# Patient Record
Sex: Male | Born: 1955 | Race: White | Hispanic: No | Marital: Married | Smoking: Never smoker
Health system: Southern US, Community
[De-identification: ages and names within clinical notes are randomized; demographics above are authoritative.]

## PROBLEM LIST (undated history)

## (undated) DIAGNOSIS — E7800 Pure hypercholesterolemia, unspecified: Secondary | ICD-10-CM

## (undated) DIAGNOSIS — F419 Anxiety disorder, unspecified: Secondary | ICD-10-CM

## (undated) DIAGNOSIS — F0280 Dementia in other diseases classified elsewhere without behavioral disturbance: Secondary | ICD-10-CM

## (undated) DIAGNOSIS — I10 Essential (primary) hypertension: Secondary | ICD-10-CM

## (undated) HISTORY — PX: HERNIA REPAIR: SHX51

---

## 1999-11-16 ENCOUNTER — Ambulatory Visit (HOSPITAL_COMMUNITY): Admission: RE | Admit: 1999-11-16 | Discharge: 1999-11-16 | Payer: Self-pay | Admitting: Gastroenterology

## 2002-12-07 ENCOUNTER — Emergency Department (HOSPITAL_COMMUNITY): Admission: EM | Admit: 2002-12-07 | Discharge: 2002-12-07 | Payer: Self-pay | Admitting: *Deleted

## 2002-12-07 ENCOUNTER — Encounter: Payer: Self-pay | Admitting: General Surgery

## 2004-11-09 ENCOUNTER — Ambulatory Visit (HOSPITAL_COMMUNITY): Admission: RE | Admit: 2004-11-09 | Discharge: 2004-11-09 | Payer: Self-pay | Admitting: Gastroenterology

## 2006-05-24 ENCOUNTER — Emergency Department (HOSPITAL_COMMUNITY): Admission: EM | Admit: 2006-05-24 | Discharge: 2006-05-24 | Payer: Self-pay | Admitting: Emergency Medicine

## 2009-05-31 ENCOUNTER — Emergency Department (HOSPITAL_COMMUNITY): Admission: EM | Admit: 2009-05-31 | Discharge: 2009-06-01 | Payer: Self-pay | Admitting: Emergency Medicine

## 2010-10-05 ENCOUNTER — Encounter
Admission: RE | Admit: 2010-10-05 | Discharge: 2010-10-05 | Payer: Self-pay | Source: Home / Self Care | Attending: Family Medicine | Admitting: Family Medicine

## 2010-12-18 LAB — COMPREHENSIVE METABOLIC PANEL
ALT: 20 U/L (ref 0–53)
AST: 27 U/L (ref 0–37)
Albumin: 4.3 g/dL (ref 3.5–5.2)
Alkaline Phosphatase: 59 U/L (ref 39–117)
BUN: 12 mg/dL (ref 6–23)
CO2: 22 mEq/L (ref 19–32)
Calcium: 9.3 mg/dL (ref 8.4–10.5)
Chloride: 101 mEq/L (ref 96–112)
Creatinine, Ser: 0.93 mg/dL (ref 0.4–1.5)
GFR calc Af Amer: >60 mL/min (ref >60)
GFR calc non Af Amer: >60 mL/min (ref >60)
Glucose, Bld: 104 mg/dL — ABNORMAL HIGH (ref 70–99)
Potassium: 3.6 mEq/L (ref 3.5–5.1)
Sodium: 134 mEq/L — ABNORMAL LOW (ref 135–145)
Total Bilirubin: 0.5 mg/dL (ref 0.3–1.2)
Total Protein: 7.8 g/dL (ref 6.0–8.3)

## 2010-12-18 LAB — CK TOTAL AND CKMB (NOT AT ARMC)
CK, MB: 2.4 ng/mL (ref 0.3–4.0)
Relative Index: 0.3 (ref 0.0–2.5)
Relative Index: 0.5 (ref 0.0–2.5)
Total CK: 464 U/L — ABNORMAL HIGH (ref 7–232)
Total CK: 734 U/L — ABNORMAL HIGH (ref 7–232)

## 2010-12-18 LAB — CBC
HCT: 43.5 % (ref 39.0–52.0)
Hemoglobin: 15 g/dL (ref 13.0–17.0)
MCHC: 34.4 g/dL (ref 30.0–36.0)
MCV: 90.8 fL (ref 78.0–100.0)
Platelets: 254 10*3/uL (ref 150–400)
RBC: 4.79 MIL/uL (ref 4.22–5.81)
RDW: 12.8 % (ref 11.5–15.5)
WBC: 14.3 10*3/uL — ABNORMAL HIGH (ref 4.0–10.5)

## 2010-12-18 LAB — DIFFERENTIAL
Eosinophils Absolute: 0 10*3/uL (ref 0.0–0.7)
Lymphs Abs: 1.3 10*3/uL (ref 0.7–4.0)
Monocytes Relative: 5 % (ref 3–12)
Neutro Abs: 12.2 10*3/uL — ABNORMAL HIGH (ref 1.7–7.7)
Neutrophils Relative %: 85 % — ABNORMAL HIGH (ref 43–77)

## 2010-12-18 LAB — LIPASE, BLOOD: Lipase: 23 U/L (ref 11–59)

## 2010-12-18 LAB — TROPONIN I: Troponin I: 0.01 ng/mL (ref 0.00–0.06)

## 2011-01-29 NOTE — Op Note (Signed)
NAMEABDOU, STOCKS               ACCOUNT NO.:  [id number]   MEDICAL RECORD NO.:  [id number]          PATIENT TYPE:  AMB   LOCATION:  ENDO                         FACILITY:  [facility]   PHYSICIAN:  James L. Malon Kindle., M.D.DATE OF BIRTH:  [birth year]   DATE OF PROCEDURE:  11/09/2004  DATE OF DISCHARGE:                                 OPERATIVE REPORT   PROCEDURE:  Colonoscopy.   MEDICATIONS:  Fentanyl 100 mcg, Versed 10 mg IV.   INDICATIONS FOR PROCEDURE:  Rectal bleeding in a gentleman with a strong  family history of colon cancer in his mother.   DESCRIPTION OF PROCEDURE:  The procedure had been explained to the patient  and consent obtained. With the patient in the left lateral decubitus  position, the Olympus pediatric scope was inserted and advanced. The prep  was excellent. We were able to reach the cecum without difficulty. The scope  was withdrawn and the cecum, ascending colon, hepatic flexure, transverse  colon, splenic flexure, descending and sigmoid colon were seen well. No  polyps were seen throughout the entire colon. There was no diverticular  disease. The scope was withdrawn down the rectum. There were internal  hemorrhoids seen in the rectum upon removal of the scope. The scope was  withdrawn. The patient tolerated the procedure well.   ASSESSMENT:  1.  Rectal bleeding, 578.1.  2.  Family history of colon cancer, V16.0.  3.  Internal hemorrhoids, 455.0.   PLAN:  Will recommend yearly Hemoccults and repeat colonoscopy in five  years.      JLE/MEDQ  D:  11/09/2004  T:  11/09/2004  Job:  [id number]   cc:   Schuyler Amor, M.D.  [address]  [place], [place] [address]  Fax: [phone]

## 2011-01-29 NOTE — Consult Note (Signed)
[name], [name]                      ACCOUNT NO.:  [id number]   MEDICAL RECORD NO.:  [id number]                   PATIENT TYPE:  EMS   LOCATION:  ED                                   FACILITY:  [facility]   PHYSICIAN:  Angelia Mould. Derrell Lolling, M.D.             DATE OF BIRTH:  05/03/1956   DATE OF CONSULTATION:  12/07/2002  DATE OF DISCHARGE:                                   CONSULTATION   CHIEF COMPLAINT:  Diarrhea and fatigue.   HISTORY OF PRESENT ILLNESS:  This is a 55 year old white man who became ill  yesterday with fatigue and low-grade fever and chills.  At about 8 p.m. last  night he began having diarrhea and states that he has been having fairly  significant non-bloody diarrhea with stools and flatus occurring about every  15 minutes.  He denies nausea or vomiting.  He denies pain or cramps.  He  has not had any prior similar problems.  He has not seen any blood in his  stool.  His bowel movements are usually normal.  Because this has continued  and he was getting quite fatigued, he went to [facility]  today.  Abdominal x-rays were performed there which showed a significantly  distended colon with a dilated cecum, transverse colon, descending colon,  and sigmoid colon.  They were concerned about a bowel obstruction.  He was  sent to the emergency department for an emergency department physician  evaluation.   Dr. Mariel Aloe called me and asked me to evaluate the patient.   Currently, the patient denies pain and states that he is hungry, but he is  tired.  His last bowel movement and passage of flatus was about 15 minutes  ago.   PAST MEDICAL HISTORY:  1. Stress disorder and some type of psychiatric problem for which he is     followed by a psychiatrist, Dr. Meredith Staggers.  2. Tonsillectomy in 1965.  3. Right inguinal hernia repair at age 59.  4. History of sinus infections in the past.  5. He had some bleeding from hemorrhoids a couple of years ago, and  had a     colonoscopy by Dr. Carman Ching in 3/01, and he states that Dr. Randa Evens     told him that the colonoscopy was normal except for hemorrhoids.   CURRENT MEDICATIONS:  1. Prozac one tablet daily.  2. Klonopin two tablets daily.   ALLERGIES:  No known drug allergies.   FAMILY HISTORY:  Mother living has Alzheimer's.  Has had a small bowel  obstruction for carcinoma of the small intestine.  She has glaucoma.  Father  deceased in 26, from prostate cancer.  He also had hypertension.  He has  two brothers living and well.  There is no family history of diabetes or  cancer otherwise.   SOCIAL HISTORY:  The patient is married, they have no children.  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  Denies the use of tobacco.  He  drinks alcohol infrequently.   REVIEW OF SYMPTOMS:  CONSTITUTIONAL:  No weight gain or weight loss.  Some  low-grade fever and chills.  EYES:  No visual change, no diplopia, no eye  problems.  HEENT:  He does have a history of sinusitis.  No history of  facial fracture.  No history of lesions of the tongue or oropharynx.  No  history of thyroid disease.  RESPIRATORY:  Denies asthma, bronchitis,  shortness of breath, or orthopnea.  CARDIAC:  No chest pain, no  palpitations, no history of cardiac disease or valvular heart problems.  GASTROINTESTINAL:  Please see history of present illness.  MUSCULOSKELETAL:  Denies orthopaedic injuries or fractures.  Denies arthritis.  NEUROLOGIC:  No history of seizures or syncope.  PSYCHIATRIC:  Does have a stress  disorder and possible depression, followed by a psychiatrist here in town.   PHYSICAL EXAMINATION:  GENERAL:  A pleasant middle-aged man slightly  overweight.  Appears somewhat fatigued, but really is in no acute distress.  VITAL SIGNS:  Temperature 97.4, heart rate 121 and regular, respirations 20,  blood pressure 136/83.  HEENT:  Eyes:  Sclerae clear.  Extraocular movements were intact.  Nose,   lips, tongue, and oropharynx without gross lesions.  NECK:  No thyroid mass, no adenopathy, no jugular venous distention,  nontender.  Trachea midline.  RESPIRATORY:  Lungs clear to auscultation, no wheezing, no CVA tenderness.  CARDIOVASCULAR:  Heart with a regular tachycardia, no murmurs or rubs.  Radial, femoral, and posterior tibial pulses are palpable.  ABDOMEN:  The abdomen is borderline obese, soft, nontender, nondistended,  bowel sounds are present.  There is no hernia.  There is no palpable mass.  The abdominal examination is essentially benign.  GENITOURINARY:  Penis, scrotum, and testes are normal.  RECTAL:  Deferred.  MUSCULOSKELETAL:  Full range of motion, no deformity, no edema.  NEUROLOGIC:  No gross motor or sensory deficits.   LABORATORY DATA:  Repeat abdominal films show some air throughout the colon,  but the colon is not distended at all, suggesting it has been decompressed  since the films earlier in the day.  There is no free air or obstructive  pattern.  Hemoglobin 14, white blood cell count 6200.  Chemistries are  pending.   ASSESSMENT:  1. Acute diarrhea syndrome.  Suspect viral gastroenteritis.  2. I doubt that he has a bowel obstruction or any acute surgical problem.  3. He does appear to be dehydrated.   PLAN:  1. The patient will get 2 to 3 L of IV fluids to correct his dehydration.  I     have turned his care over to Dr Susy Manor who is the emergency     department physician who agrees to assume his care.  He will check on the     chemistries and see that he is stabilized before going home.  2. The patient will return to see me p.r.n.                                               Angelia Mould. Derrell Lolling, M.D.    HMI/MEDQ  D:  12/07/2002  T:  12/08/2002  Job:  [id number]

## 2011-04-03 ENCOUNTER — Emergency Department (HOSPITAL_COMMUNITY): Payer: BC Managed Care – PPO

## 2011-04-03 ENCOUNTER — Inpatient Hospital Stay (HOSPITAL_COMMUNITY)
Admission: EM | Admit: 2011-04-03 | Discharge: 2011-04-08 | DRG: 297 | Disposition: A | Discharge status: Home / Self Care | Payer: BC Managed Care – PPO | Attending: Internal Medicine | Admitting: Internal Medicine

## 2011-04-03 DIAGNOSIS — T43505A Adverse effect of unspecified antipsychotics and neuroleptics, initial encounter: Secondary | ICD-10-CM | POA: Diagnosis present

## 2011-04-03 DIAGNOSIS — S0180XA Unspecified open wound of other part of head, initial encounter: Secondary | ICD-10-CM | POA: Diagnosis present

## 2011-04-03 DIAGNOSIS — E871 Hypo-osmolality and hyponatremia: Principal | ICD-10-CM | POA: Diagnosis present

## 2011-04-03 DIAGNOSIS — H6690 Otitis media, unspecified, unspecified ear: Secondary | ICD-10-CM | POA: Diagnosis present

## 2011-04-03 DIAGNOSIS — E785 Hyperlipidemia, unspecified: Secondary | ICD-10-CM | POA: Diagnosis present

## 2011-04-03 DIAGNOSIS — W010XXA Fall on same level from slipping, tripping and stumbling without subsequent striking against object, initial encounter: Secondary | ICD-10-CM | POA: Diagnosis present

## 2011-04-03 DIAGNOSIS — R680 Hypothermia, not associated with low environmental temperature: Secondary | ICD-10-CM | POA: Diagnosis present

## 2011-04-03 DIAGNOSIS — I10 Essential (primary) hypertension: Secondary | ICD-10-CM | POA: Diagnosis present

## 2011-04-03 DIAGNOSIS — F341 Dysthymic disorder: Secondary | ICD-10-CM | POA: Diagnosis present

## 2011-04-03 DIAGNOSIS — D72829 Elevated white blood cell count, unspecified: Secondary | ICD-10-CM | POA: Diagnosis present

## 2011-04-03 DIAGNOSIS — G9341 Metabolic encephalopathy: Secondary | ICD-10-CM | POA: Diagnosis present

## 2011-04-03 DIAGNOSIS — Y9289 Other specified places as the place of occurrence of the external cause: Secondary | ICD-10-CM

## 2011-04-03 DIAGNOSIS — R82998 Other abnormal findings in urine: Secondary | ICD-10-CM | POA: Diagnosis present

## 2011-04-04 ENCOUNTER — Inpatient Hospital Stay (HOSPITAL_COMMUNITY): Payer: BC Managed Care – PPO

## 2011-04-04 LAB — URINALYSIS, ROUTINE W REFLEX MICROSCOPIC
Bilirubin Urine: NEGATIVE
Glucose, UA: NEGATIVE mg/dL
Hgb urine dipstick: NEGATIVE
Ketones, ur: 40 mg/dL — AB
Leukocytes, UA: NEGATIVE
pH: 7.5 (ref 5.0–8.0)

## 2011-04-04 LAB — COMPREHENSIVE METABOLIC PANEL
ALT: 27 U/L (ref 0–53)
AST: 31 U/L (ref 0–37)
Alkaline Phosphatase: 65 U/L (ref 39–117)
CO2: 25 mEq/L (ref 19–32)
Chloride: 74 mEq/L — ABNORMAL LOW (ref 96–112)
Creatinine, Ser: 0.61 mg/dL (ref 0.50–1.35)
GFR calc non Af Amer: >60 mL/min (ref >60)
Sodium: 108 mEq/L — CL (ref 135–145)
Total Bilirubin: 0.8 mg/dL (ref 0.3–1.2)

## 2011-04-04 LAB — RAPID URINE DRUG SCREEN, HOSP PERFORMED
Amphetamines: NOT DETECTED
Barbiturates: NOT DETECTED
Benzodiazepines: POSITIVE — AB
Tetrahydrocannabinol: NOT DETECTED

## 2011-04-04 LAB — BASIC METABOLIC PANEL
BUN: 7 mg/dL (ref 6–23)
CO2: 21 mEq/L (ref 19–32)
CO2: 25 mEq/L (ref 19–32)
CO2: 25 mEq/L (ref 19–32)
Calcium: 8.1 mg/dL — ABNORMAL LOW (ref 8.4–10.5)
Calcium: 8.3 mg/dL — ABNORMAL LOW (ref 8.4–10.5)
Calcium: 8.4 mg/dL (ref 8.4–10.5)
Creatinine, Ser: 0.67 mg/dL (ref 0.50–1.35)
GFR calc non Af Amer: >60 mL/min (ref >60)
Glucose, Bld: 100 mg/dL — ABNORMAL HIGH (ref 70–99)
Glucose, Bld: 112 mg/dL — ABNORMAL HIGH (ref 70–99)
Sodium: 109 mEq/L — CL (ref 135–145)
Sodium: 113 mEq/L — CL (ref 135–145)

## 2011-04-04 LAB — DIFFERENTIAL
Basophils Absolute: 0 10*3/uL (ref 0.0–0.1)
Basophils Relative: 0 % (ref 0–1)
Eosinophils Absolute: 0.1 10*3/uL (ref 0.0–0.7)
Eosinophils Relative: 1 % (ref 0–5)
Lymphocytes Relative: 14 % (ref 12–46)
Lymphs Abs: 1.8 10*3/uL (ref 0.7–4.0)
Monocytes Absolute: 1 10*3/uL (ref 0.1–1.0)
Monocytes Relative: 7 % (ref 3–12)
Neutro Abs: 11.1 10*3/uL — ABNORMAL HIGH (ref 1.7–7.7)
Neutrophils Relative %: 85 % — ABNORMAL HIGH (ref 43–77)

## 2011-04-04 LAB — URINALYSIS, MICROSCOPIC ONLY
Bilirubin Urine: NEGATIVE
Nitrite: NEGATIVE
Specific Gravity, Urine: 1.018 (ref 1.005–1.030)
Urobilinogen, UA: 0.2 mg/dL (ref 0.0–1.0)

## 2011-04-04 LAB — VALPROIC ACID LEVEL: Valproic Acid Lvl: 16.4 ug/mL — ABNORMAL LOW (ref 50.0–100.0)

## 2011-04-04 LAB — CBC
HCT: 34.2 % — ABNORMAL LOW (ref 39.0–52.0)
Hemoglobin: 12.4 g/dL — ABNORMAL LOW (ref 13.0–17.0)
MCH: 30.4 pg (ref 26.0–34.0)
MCHC: 36 g/dL (ref 30.0–36.0)
MCV: 84.2 fL (ref 78.0–100.0)
Platelets: 272 10*3/uL (ref 150–400)
RBC: 4.08 MIL/uL — ABNORMAL LOW (ref 4.22–5.81)
RDW: 11.9 % (ref 11.5–15.5)

## 2011-04-04 LAB — LACTIC ACID, PLASMA: Lactic Acid, Venous: 2.6 mmol/L — ABNORMAL HIGH (ref 0.5–2.2)

## 2011-04-04 LAB — MRSA PCR SCREENING: MRSA by PCR: NEGATIVE

## 2011-04-04 LAB — CORTISOL: Cortisol, Plasma: 20.2 ug/dL

## 2011-04-04 LAB — NA AND K (SODIUM & POTASSIUM), RAND UR: Sodium, Ur: 86 mEq/L

## 2011-04-04 LAB — PROCALCITONIN: Procalcitonin: <0.1 ng/mL

## 2011-04-04 MED ORDER — GADOBENATE DIMEGLUMINE 529 MG/ML IV SOLN
7.0000 mL | Freq: Once | INTRAVENOUS | Status: AC | PRN
  Administered 2011-04-04: 7 mL via INTRAVENOUS

## 2011-04-04 NOTE — Consult Note (Signed)
[name], WORD NO.:  [id number]  MEDICAL RECORD NO.:  [id number]  LOCATION:  1239                         FACILITY:  [facility]  PHYSICIAN:  Maree Krabbe, M.D.DATE OF BIRTH:  1956-05-27  DATE OF CONSULTATION:  04/04/2011 DATE OF DISCHARGE:                                CONSULTATION   Renal Consultation  REASON FOR CONSULTATION:  Hyponatremia and altered mental status.  HISTORY:  Patient is a 55 year old male, relatively healthy, takes medicine for hyperlipidemia, anxiety, panic attacks.  He presented to the Emergency Room with confusion and falling.  He fell in the Emergency Room parking lot getting out of the car and scraped up his face and had some stitches put in.  Blood work returned with a sodium of 109.  The head CT was negative.  The patient was noted to be somewhat confused with slow speech response and some slurring of the speech.  He was admitted to ICU.  The patient currently denies any recent use of over- the-counter medicines in large amounts.  He takes an occasional BC or NSAID, not a lot lately.  Denies any diuretic use.  History of high blood pressure.  He says he takes Klonopin for anxiety for last 5 years and cholesterol medication, simvastatin for the last two or three years. He is married, lives with his wife at home and have no children.  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 He does not smoke or drink or do any drugs.  PAST MEDICAL HISTORY: 1. Anxiety. 2. Hyperlipidemia. 3. Panic attacks.  PAST SURGICAL HISTORY:  Noncontributory.  MEDICATIONS: 1. Simvastatin. 2. Klonopin. 3. Nasal decongestant.  According to the patient.  SOCIAL HISTORY:  As above.  REVIEW OF SYSTEMS:  Denies any fever, chills, sweats, shortness of breath, cough, chest pain, abdominal pain, nausea, recent nausea, vomiting, diarrhea, difficulty voiding or dysuria, joint pain or swelling, skin rash or itching,  focal numbness or weakness.  PHYSICAL EXAMINATION:  VITAL SIGNS:  Temperature is 96.5, blood pressure 107/50, pulse 80, respirations 20. GENERAL:  The patient is overweight, middle-aged male, in no distress. SKIN:  Warm and dry.  He has several scrapes and couple of lacerations on his face. HEENT:  PERRL, EOMI.  Sclerae anicteric.  The pupils are equally reactive.  The gaze is conjugate.  Throat is clear. NECK:  Supple with no JVD or thyromegaly. CHEST:  Clear throughout.  No rales, rhonchi or wheezing. CARDIAC:  Regular rate and rhythm without murmur, rub or gallop. ABDOMEN:  Obese, nontender, soft.  Active bowel sounds. GU:  Normal male genitalia.  Has a Foley catheter in place. EXTREMITIES:  No ankle edema.  No joint effusion or deformity. NEUROLOGIC:  Moves all four extremities equally well.  Speech is slow and is a bit slurred and patient is slow to respond, buy ties his best.  LABORATORY DATA:  Sodium 108, BUN 10, creatinine 0.6, potassium 4.5, CO2 of 25, magnesium 2.0, calcium 8.6, albumin 3.4.  Hemoglobin 12, white blood count 12.  Glucose 136.  Urine osmolality 414.  Urine sodium 86, urine potassium 53, urine sodium plus potassium equals 139.  IMPRESSION: 1. Hyponatremia, severe  with moderately severe symptoms of confusion     and altered speech.  This is probably a subacute or chronic     presentation since the patient does not give a history of any     recent surgery, marathon race, etc.  Urine     osmolality is high, urine Na is high, so this may be consistent      with the syndrome of inappropriate antidiuretic hormone (SIADH).     Recommend treating with 3% hypertonic saline for now at a modest     rate to correct serum sodium 10 mEq/L over a 24-hour period.  This     would be an estimated sodium deficit of 480 mEq, which would equal     37 mL an hour for 24 hours using hypertonic saline.  We will start 30     mL an hour and measure Chem-7 every 4 hours for now.  Once  the     serum sodium is up around 120, we can initiate oral therapy. 2. Altered mental status due to hyponatremia.  CT scan negative. 3. Hyperlipidemia. 4. Anxiety disorder.  PLAN AND RECOMMENDATIONS:  Check TSH, cortisol.  Check BMET q.4h. Hypertonic saline at 30 mL an hour for now.     Maree Krabbe, M.D.     RDS/MEDQ  D:  04/04/2011  T:  04/04/2011  Job:  [id number]  Electronically Signed by Delano Metz M.D. on 04/04/2011 08:53:05 PM

## 2011-04-05 LAB — BASIC METABOLIC PANEL
BUN: 6 mg/dL (ref 6–23)
BUN: 6 mg/dL (ref 6–23)
BUN: 7 mg/dL (ref 6–23)
CO2: 23 mEq/L (ref 19–32)
CO2: 25 mEq/L (ref 19–32)
Chloride: 90 mEq/L — ABNORMAL LOW (ref 96–112)
Chloride: 93 mEq/L — ABNORMAL LOW (ref 96–112)
Chloride: 92 mEq/L — ABNORMAL LOW (ref 96–112)
Chloride: 93 mEq/L — ABNORMAL LOW (ref 96–112)
Creatinine, Ser: 0.6 mg/dL (ref 0.50–1.35)
GFR calc Af Amer: >60 mL/min (ref >60)
GFR calc Af Amer: >60 mL/min (ref >60)
GFR calc Af Amer: >60 mL/min (ref >60)
GFR calc Af Amer: >60 mL/min (ref >60)
GFR calc non Af Amer: >60 mL/min (ref >60)
GFR calc non Af Amer: >60 mL/min (ref >60)
GFR calc non Af Amer: >60 mL/min (ref >60)
Glucose, Bld: 101 mg/dL — ABNORMAL HIGH (ref 70–99)
Glucose, Bld: 89 mg/dL (ref 70–99)
Glucose, Bld: 97 mg/dL (ref 70–99)
Potassium: 3.2 mEq/L — ABNORMAL LOW (ref 3.5–5.1)
Potassium: 3.5 mEq/L (ref 3.5–5.1)
Potassium: 4.1 mEq/L (ref 3.5–5.1)
Potassium: 4.5 mEq/L (ref 3.5–5.1)
Potassium: 4.1 mEq/L (ref 3.5–5.1)
Sodium: 120 mEq/L — ABNORMAL LOW (ref 135–145)
Sodium: 126 mEq/L — ABNORMAL LOW (ref 135–145)
Sodium: 125 mEq/L — ABNORMAL LOW (ref 135–145)
Sodium: 126 mEq/L — ABNORMAL LOW (ref 135–145)

## 2011-04-05 LAB — GLUCOSE, CAPILLARY: Glucose-Capillary: 102 mg/dL — ABNORMAL HIGH (ref 70–99)

## 2011-04-05 LAB — CBC
HCT: 33.6 % — ABNORMAL LOW (ref 39.0–52.0)
Hemoglobin: 12 g/dL — ABNORMAL LOW (ref 13.0–17.0)
RBC: 3.96 MIL/uL — ABNORMAL LOW (ref 4.22–5.81)
WBC: 9.4 10*3/uL (ref 4.0–10.5)

## 2011-04-05 LAB — DIFFERENTIAL
Basophils Absolute: 0 10*3/uL (ref 0.0–0.1)
Lymphocytes Relative: 12 % (ref 12–46)
Neutro Abs: 7.2 10*3/uL (ref 1.7–7.7)

## 2011-04-05 NOTE — H&P (Signed)
[name], COSSEY NO.:  [id number]  MEDICAL RECORD NO.:  [id number]  LOCATION:  1239                         FACILITY:  [facility]  PHYSICIAN:  Della Goo, M.D. DATE OF BIRTH:  [birth year]  DATE OF ADMISSION:  04/03/2011 DATE OF DISCHARGE:                             HISTORY & PHYSICAL   DATE OF ADMISSION:  April 04, 2011.  PRIMARY CARE PHYSICIAN:  [name], MD  PSYCHIATRIST:  Meredith Staggers, MD  CHIEF COMPLAINT:  Anxiety.  HISTORY OF PRESENT ILLNESS:  This is a 55 year old male who was brought to the emergency department by his wife secondary to complaints of increased anxiety.  Upon arriving to the emergency department, the patient suffered a fall in the parking lot injuring his face, hands and knees.  The patient was brought in and evaluated.  He had x-rays performed and a CT scan of his head performed and C-spine, which were all negative for any acute findings or fracture.  The patient had multiple abrasions and contusions from the fall.  Laboratory studies were also performed and the patient was found to have a sodium level of 109.  The patient and his wife report that the patient has been having dizziness and problems with his equilibrium for approximately a year. He has been seen by the Ear, Nose, Throat doctor, Dr. Suszanne Reeves, and was told that he had eustachian tube problems of both ears.  The patient also has been seeing psychiatry or psychology and has been on medications for his anxiety and depression.  The patient does not know when he last had any laboratory studies performed.  PAST MEDICAL HISTORY:  Significant for anxiety, depression, and ear problems along with morbid obesity.  PAST SURGICAL HISTORY:  Tonsillectomy.  MEDICATIONS:  Klonopin, simvastatin, sertraline.  There are other medications which will need to be appended.  The patient's pharmacy is the [facility] on [address] in [place].  ALLERGIES:  No known drug  allergies.  SOCIAL HISTORY:  The patient is married.  He is a nonsmoker, nondrinker. No history of illicit drug usage.  FAMILY HISTORY:  Noncontributory.  REVIEW OF SYSTEMS:  The patient reports having dizziness, weakness, hesitation of his speech, confusion, and myalgias.  He denies having any seizures or syncope.  He denies having any chest pain.  Denies having any shortness of breath.  He does report having anxiety.  The patient denies having any nausea, vomiting or diarrhea.  Denies having any constipation.  PHYSICAL EXAMINATION FINDINGS:  GENERAL:  This is a 55 year old morbidly obese Caucasian male with multiple facial abrasions but in no acute distress currently.  VITAL SIGNS:  Temperature 98.2, blood pressure 123/90, heart rate 79, respirations 20, O2 saturations 98%. HEENT:  Normocephalic.  Positive multiple facial abrasions and positive erythema and ecchymosis beginning to flourish on the central face area and nose.  Pupils are equally round, reactive to light.  Extraocular movements intact.  No scleral erythema.  No scleral icterus. Funduscopic is benign.  Nares are patent.  Tympanic membranes, no hemotympanum.  Oropharynx is clear.  No tongue lacerations. NECK:  Supple, full range of motion.  No thyromegaly, adenopathy, or jugular venous distention. CARDIOVASCULAR:  Regular rate  and rhythm.  No murmurs, gallops or rubs appreciated.  Chest wall has normal excursion.  Breathing is unlabored. LUNGS:  Clear to auscultation bilaterally.  No rales, rhonchi or wheezes. ABDOMEN:  Obese.  Positive bowel sounds.  Abdomen is soft, nontender, nondistended.  No hepatosplenomegaly. EXTREMITIES:  Without cyanosis or clubbing.  The patient has fresh abrasions on both knees.  The patient also has abrasions on his knuckles on the left and right hand. NEUROLOGIC:  The patient is alert and oriented x2.  His speech is hesitant, however, there is no dysarthria.  The patient is able to  move all four of his extremities.  He has no motor or sensory deficits. There is generalized weakness.  LABORATORY STUDIES:  White blood cell count 12.7, hemoglobin 12.3, hematocrit 34.2, MCV 84.2, platelets 172, neutrophils 77%, lymphocytes 14%.  Sodium 109, potassium 3.8, chloride 78, carbon dioxide 21, BUN 8, creatinine 0.67, glucose 100.  ProTime 13.0, INR 0.96.  Alcohol level less than 11.  CT scan of the head performed and CT scan of the C-spine performed:  All are negative for any acute fractures.  No acute intracranial hemorrhage is seen.  No focal mass lesion is seen.  No CT evidence of acute infarction.  No midline shift or mass effect.  No hydrocephalus.  ASSESSMENT:  55 year old male being admitted with: 1. Severe hyponatremia and this appears to be chronic since the     patient is talking and is awake and alert. 2. Generalized weakness secondary #1. 3. Mild anemia. 4. Multiple contusions and abrasions status post mechanical fall. 5. Mechanical fall. 6. Anxiety. 7. Depression. 8. Morbid obesity.  PLAN:  The patient will be admitted to the step-down ICU area for monitoring.  The patient appears to be euvolemic and a fluid restriction will be initiated.  In the history, the patient states that he does drink excessive amounts of water and fluids, and if he drinks as much as he says, he has been drinking 1.4 gallons of fluid; however this may not be accurate.  His BUN and creatinine appears to be within normal limits. However, it is not clear what his baseline BUN and creatinine is.  The patient's medications, unless he is on psychotropic medications which cause polydipsia, that remains to be seen at this time as well.  A urinalysis has been performed, the results were after this assessment.  The results of the Urinalysis returned after this assessment and the patient was found to have ketones in the urine which suggest that the patient does not have excessive water  ingestion.   Thereby, an MRI study will be performed of the brain to evaluate the pituitary area further.  Urine electrolytes will also be ordered  for urine sodium and urine potassium as well as urine osmolarity.    Further workup will ensue based on results of these tests, and further treatments will also be adjusted pending this result.  The patient's  electrolytes will be monitored.  The patient's regular medications will  be further verified and the patient will be placed on DVT prophylaxis.   At this time, he is a full code.     Della Goo, M.D.     HJ/MEDQ  D:  04/04/2011  T:  04/04/2011  Job:  [id number]  cc:   Emeterio Reeve, MD Fax: [phone]  Electronically Signed by Della Goo M.D. on 04/05/2011 01:06:16 PM

## 2011-04-06 LAB — BASIC METABOLIC PANEL
BUN: 9 mg/dL (ref 6–23)
CO2: 24 mEq/L (ref 19–32)
CO2: 24 mEq/L (ref 19–32)
Calcium: 9 mg/dL (ref 8.4–10.5)
Chloride: 92 mEq/L — ABNORMAL LOW (ref 96–112)
GFR calc non Af Amer: >60 mL/min (ref >60)
GFR calc non Af Amer: >60 mL/min (ref >60)
Glucose, Bld: 114 mg/dL — ABNORMAL HIGH (ref 70–99)
Glucose, Bld: 90 mg/dL (ref 70–99)
Glucose, Bld: 97 mg/dL (ref 70–99)
Potassium: 3.6 mEq/L (ref 3.5–5.1)
Potassium: 3.8 mEq/L (ref 3.5–5.1)
Potassium: 4 mEq/L (ref 3.5–5.1)
Sodium: 125 mEq/L — ABNORMAL LOW (ref 135–145)
Sodium: 126 mEq/L — ABNORMAL LOW (ref 135–145)
Sodium: 127 mEq/L — ABNORMAL LOW (ref 135–145)

## 2011-04-06 LAB — URINE CULTURE
Colony Count: NO GROWTH
Culture  Setup Time: 201207230938

## 2011-04-06 LAB — CBC
HCT: 36.7 % — ABNORMAL LOW (ref 39.0–52.0)
Hemoglobin: 12.6 g/dL — ABNORMAL LOW (ref 13.0–17.0)
MCH: 30.1 pg (ref 26.0–34.0)
MCHC: 34.3 g/dL (ref 30.0–36.0)
RBC: 4.19 MIL/uL — ABNORMAL LOW (ref 4.22–5.81)

## 2011-04-06 LAB — DIFFERENTIAL
Basophils Relative: 0 % (ref 0–1)
Lymphocytes Relative: 18 % (ref 12–46)
Monocytes Absolute: 1.2 10*3/uL — ABNORMAL HIGH (ref 0.1–1.0)
Monocytes Relative: 11 % (ref 3–12)
Neutro Abs: 7.3 10*3/uL (ref 1.7–7.7)
Neutrophils Relative %: 70 % (ref 43–77)

## 2011-04-07 DIAGNOSIS — F4489 Other dissociative and conversion disorders: Secondary | ICD-10-CM

## 2011-04-07 LAB — CBC
Hemoglobin: 12.7 g/dL — ABNORMAL LOW (ref 13.0–17.0)
Platelets: 275 10*3/uL (ref 150–400)
RBC: 4.29 MIL/uL (ref 4.22–5.81)
WBC: 11 10*3/uL — ABNORMAL HIGH (ref 4.0–10.5)

## 2011-04-07 LAB — BASIC METABOLIC PANEL
CO2: 27 mEq/L (ref 19–32)
Chloride: 93 mEq/L — ABNORMAL LOW (ref 96–112)
Glucose, Bld: 90 mg/dL (ref 70–99)
Potassium: 4 mEq/L (ref 3.5–5.1)
Sodium: 128 mEq/L — ABNORMAL LOW (ref 135–145)

## 2011-04-07 NOTE — Consult Note (Signed)
[name], [name] NO.:  [id number]  MEDICAL RECORD NO.:  [id number]  LOCATION:  1445                         FACILITY:  [facility]  PHYSICIAN:  Eulogio Ditch, MD DATE OF BIRTH:  09-Dec-1955  DATE OF CONSULTATION:  04/07/2011 DATE OF DISCHARGE:                                CONSULTATION   REASON FOR CONSULTATION:  Depression/anxiety.  HISTORY OF PRESENT ILLNESS:  The patient is a 55 year old male who was brought in to ER by his wife due to complaints of increased anxiety but the patient suffered a fall in the parking lot and got bruises and lacerations on his face, hands and knees.  CT scan of the head performed and of C-spine which were all negative for any acute findings or fracture.  The patient also has issues with his eustachian tubes in the ears and has complaints of dizziness and equilibrium for the last 1 year.  The patient told me that he follows Dr. Meredith Staggers for his medications for anxiety and he is a psychiatrist and he is seeing him for the last 15 years.  The patient is on Klonopin, Zoloft, Depakote and doxepin but was unable to remember the doses of these medications.  The patient denied any past psych hospitalization or history of suicide attempt. The patient denies any drug abuse.  The patient reported that recently his supervisor at the work is changed and he liked the old one better and this new one is getting problems and he has difficulty while working with him.  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  I asked him whether he is becoming depressed also because of this stress at job and he told me though he is not depressed but his anxiety increased and panic attacks increased.  He denied any suicidal ideations.  He said he is not going to kill himself because of this stress.  He reported sleep and appetite okay.  The patient denied hearing any voices.  MEDICAL HISTORY:  History of ear problem, obesity.  ALLERGIES:   No known drug allergies.  SOCIAL HISTORY:  The patient is married.  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  SUBSTANCE ABUSE HISTORY:  The patient denies abusing any illicit drugs or alcohol.  MENTAL STATUS EXAM:  The patient is calm, cooperative during the interview.  Fair eye contact.  No abnormal movements noticed.  Speech normal in rate, rhythm and volume.  Mood anxious.  Affect mood congruent.  Thought process logical and goal directed.  The patient has some difficulty describing his history but that can be because of recent trauma.  Not suicidal or homicidal, not delusional, not hallucinating, not internally preoccupied.  Cognition; alert, awake, oriented x3. Memory; immediate, recent remote fair.  Not very good as he was not able to tell me the doses of his medications.  Attention and concentration fair.  Abstraction ability fair.  Insight and judgment intact.  DIAGNOSIS:  AXIS I:  Anxiety disorder, not otherwise specified, rule out panic disorder. AXIS II:  Deferred. AXIS III:  See medical notes.AXIS IV:  Recent stress at the job. AXIS V:  50.  RECOMMENDATIONS: 1. As the patient's sodium level is decreased and it can be because of     his psych medications Zoloft, Depakote or doxepin; so I will not     start these medications at this time.  The patient told me his main     medication for anxiety is Klonopin, he is on 2 mg twice a day.  The     patient can be continued on this medication.  I told as Dr. Meredith Staggers knows the patient more, so we will not start him on these     medication and he should follow up with his psychiatrist as soon as     possible after discharge from the hospital.  The patient agreed     with this. 2. I also recommended some counseling in the outpatient setting as he     is going through lot of stress and his anxiety level is increasing.     The patient agreed with that. 3. I will tell the  clinical social worker to follow up with him for     his appointments.  Thanks for involving me in taking care of this patient.     Eulogio Ditch, MD     SA/MEDQ  D:  04/07/2011  T:  04/07/2011  Job:  [id number]  Electronically Signed by Eulogio Ditch  on 04/07/2011 08:52:26 AM

## 2011-04-08 LAB — BASIC METABOLIC PANEL
BUN: 10 mg/dL (ref 6–23)
CO2: 27 mEq/L (ref 19–32)
Chloride: 93 mEq/L — ABNORMAL LOW (ref 96–112)
GFR calc non Af Amer: >60 mL/min (ref >60)
Glucose, Bld: 88 mg/dL (ref 70–99)
Potassium: 3.7 mEq/L (ref 3.5–5.1)
Sodium: 130 mEq/L — ABNORMAL LOW (ref 135–145)

## 2011-04-10 LAB — CULTURE, BLOOD (ROUTINE X 2)
Culture  Setup Time: 201207221712
Culture: NO GROWTH

## 2011-04-14 NOTE — Discharge Summary (Signed)
NAMEMATTHEW, [name] NO.:  [id number]  MEDICAL RECORD NO.:  [id number]  LOCATION:  1445                         FACILITY:  [facility]  PHYSICIAN:  Altha Harm, MDDATE OF BIRTH:  1956/04/11  DATE OF ADMISSION:  04/03/2011 DATE OF DISCHARGE:  04/08/2011                              DISCHARGE SUMMARY   DISCHARGE DISPOSITION:  Home.  FINAL DISCHARGE DIAGNOSES: 1. Metabolic encephalopathy, resolved. 2. Hyponatremia. 3. Hypothymia, resolved. 4. Pyuria, urine culture is negative. 5. Anxiety. 6. Facial laceration, status post fall. 7. Hyperlipidemia. 8. Hypertension.  DISCHARGE MEDICATIONS: 1. Neosporin ointment apply topically to the face b.i.d. 2. Amoxicillin 875 mg p.o. b.i.d. to be completed on April 09, 2011.     This was issued prior to hospitalization for an ear infection. 3. Bystolic 2.5 mg p.o. daily. 4. Klonopin 2 mg p.o. b.i.d. and 1 mg p.o. daily p.r.n. anxiety. 5. Clonidine 0.1 mg p.o. b.i.d. 6. Flonase nasal spray 2 sprays intranasally daily. 7. Xyzal 5 mg p.o. daily. 8. Nuvigil 250 mg p.o. q.a.m. and 125 mg p.o. at noon. 9. Simvastatin 20 mg p.o. nightly. 10.Singulair 10 mg p.o. daily. 11.Vicodin 5/500 one tablet p.o. q.6 h. p.r.n. pain.  DISCONTINUED MEDICATIONS: 1. Zoloft. 2. Depakote. 3. Doxepin. 4. Flexeril.  CONSULTANTS: 1. Eulogio Ditch, MD, Psychiatry. 2. Cecille Aver, MD, Nephrology.  PROCEDURES:  None.  DIAGNOSTIC STUDIES: 1. CT of the head without contrast done on admission which shows no     evidence of cervical spinal fracture. 2. CT of the cervical spine without contrast which shows no evidence     of cervical spinal fracture. 3. Portable chest x-ray which shows mild bibasilar atelectasis.  No     focal infiltrates. 4. MRI of the brain with and without contrast which shows mild atrophy     and mild chronic microvascular ischemic change.     a.     No evidence of pituitary or parasellar lesion.  b.     No abnormal intracranial enhancement.  PRIMARY CARE PHYSICIAN:  Jasmine December A. Paulino Rily, MD, [facility]ang at [facility] Medicine.  CODE STATUS:  Full code.  ALLERGIES:  No known drug allergies.  CHIEF COMPLAINT:  Anxiety and altered mental status.  HISTORY OF PRESENT ILLNESS:  Please refer to the H and P by Dr. Lovell Sheehan for details of the HPI, however, in short, this a 55 year old gentleman who has a history of anxiety and is under the care of Dr. Meredith Staggers. The patient apparently had suffered a fall in the parking lot, injuring his face, hands, and knees.  The patient was brought and evaluated.  The patient also had laboratory studies done and was found to have a sodium level of 109.  This prompted further discussion and the wife admitted the patient had been having dizziness and problems with equilibrium for approximately 1 year.  He had been seen by Ear, Nose, and Throat and was told he had eustachian tube problems of both ears.  The patient also was being followed by Psychiatry and was on medications for his anxiety and depression.  Additionally, the patient was found to be hypothermic on admission.  Please refer to the H and P  by Dr. Della Goo for details of the HPI.  HOSPITAL COURSE: 1. Hyponatremia.  The patient was profoundly hyponatremic with a     sodium of 109.  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     The patient was placed on fluid restrictions and his antipsychotic     medications discontinued.  At the time of discharge, the patient's     sodium is now 130 mEq/L.  The patient has been restricted to 1200     mL of fluid per day and is to follow up with his primary care     physician, Dr. [name], within 3-5 days for assessment of     his electrolytes.  It was the opinion of all consultants that the     psychotropic medications were likely the biggest cause for his      hyponatremia and SIADH and thus the Depakote, Zoloft, and doxepin     had been discontinued.  The patient's primary psychotropic problem     is he relates this to his anxiety and Psychiatry recommendations     were to continue the Klonopin and to follow up with his     psychiatrist, Dr. Jennelle Human, for further adjustment of his medications     as an outpatient. 2. The patient had some leukocytosis which was felt to be reactive. 3. Pyuria, urine culture negative.  The patient had a urinalysis     suggestive of a urinary tract infection, however, the urine culture     was sterile.  It is quite likely the patient had a urinary tract     infection which is treated by the amoxicillin. 4. Facial abrasions.  As a result of his fall, the patient had some     facial abrasions and was treated with Neosporin ointment topically     twice a day and to continue until the lesions are healed.     Otherwise, the patient is continued on his usual medications for     his chronic problems.  At the time of discharge, the patient is     stable.  PHYSICAL EXAMINATION:  VITAL SIGNS:  Temperature is 98.3, heart rate 89, blood pressure 115/76, respiratory rate 12, O2 saturations are 95% on room air.  HEENT:  The patient had some mild contusions on the face. They are healing well and without any signs and symptoms of infection. Oropharynx is moist.  No exudate, erythema, or lesions are noted. NECK:  Trachea is midline.  No masses.  No thyromegaly.  No JVD.  No carotid bruit. RESPIRATORY:  The patient has a normal respiratory effort, equal excursion bilaterally.  No wheezing or rhonchi noted.  CARDIOVASCULAR: He has got a normal S1 and S2.  No murmurs, rubs, or gallops are noted. PMI is nondisplaced.  No heaves or thrills on palpation. ABDOMEN:  Obese, soft, nontender, nondistended.  No masses, no hepatosplenomegaly is noted. PSYCHIATRIC:  He is alert and oriented x3.  Good insight and cognition. Good recent and  remote recall. NEUROLOGIC:  He has no focal neurological deficits.  As a matter of fact, the patient is ambulating in the hallway without assistance.  LABORATORY STUDIES:  On today are as follows, sodium 130, potassium 3.7, chloride 93, bicarbonate 27, BUN 10, creatinine 0.75.  White blood cell count 11, hemoglobin 12.7, hematocrit 37.9, platelet count 274.  DIETARY RESTRICTIONS:  The patient should be on a heart-healthy diet.  PHYSICAL RESTRICTIONS:  Presently, the patient does not require an assistive device.  The recommendations from Physical Therapy are the patient would benefit from an outpatient vestibular program.  However, I have discussed this with the patient who wants to wait until he sees his primary care physician, Dr. Paulino Rily, before considering go to an outpatient program as he feels that with rest and just general walking with his wife, he will be fine.  This has been offered to the patient and at this time the patient has refused outpatient therapy and will discuss with his primary care physician, Dr. Paulino Rily, on his next visit.  The patient to follow up with Dr. Paulino Rily in 3-5 days and he has an appointment scheduled with Dr. Jennelle Human on April 09, 2011, at 5:15 p.m.  Total time for this discharge process including face-to-face time approximately 40 minutes.     Altha Harm, MD     MAM/MEDQ  D:  04/08/2011  T:  04/09/2011  Job:  [id number]  cc:   Jetty Duhamel., M.D. Fax: [phone]  Della Goo, M.D. Fax: [phone]  Emeterio Reeve, MD Fax: [phone]  Eulogio Ditch, MD  Cecille Aver, M.D. Fax: [phone]  Electronically Signed by Marthann Schiller MD on 04/14/2011 03:39:39 PM

## 2011-11-03 ENCOUNTER — Emergency Department (HOSPITAL_COMMUNITY)
Admission: EM | Admit: 2011-11-03 | Discharge: 2011-11-03 | Disposition: A | Discharge status: Home Health | Payer: BC Managed Care – PPO | Attending: Emergency Medicine | Admitting: Emergency Medicine

## 2011-11-03 ENCOUNTER — Other Ambulatory Visit: Payer: Self-pay

## 2011-11-03 ENCOUNTER — Emergency Department (HOSPITAL_COMMUNITY): Payer: BC Managed Care – PPO

## 2011-11-03 ENCOUNTER — Encounter (HOSPITAL_COMMUNITY): Payer: Self-pay | Admitting: Emergency Medicine

## 2011-11-03 DIAGNOSIS — E7800 Pure hypercholesterolemia, unspecified: Secondary | ICD-10-CM | POA: Insufficient documentation

## 2011-11-03 DIAGNOSIS — Z79899 Other long term (current) drug therapy: Secondary | ICD-10-CM | POA: Insufficient documentation

## 2011-11-03 DIAGNOSIS — R079 Chest pain, unspecified: Secondary | ICD-10-CM | POA: Insufficient documentation

## 2011-11-03 DIAGNOSIS — I10 Essential (primary) hypertension: Secondary | ICD-10-CM | POA: Insufficient documentation

## 2011-11-03 DIAGNOSIS — Z7982 Long term (current) use of aspirin: Secondary | ICD-10-CM | POA: Insufficient documentation

## 2011-11-03 DIAGNOSIS — F411 Generalized anxiety disorder: Secondary | ICD-10-CM | POA: Insufficient documentation

## 2011-11-03 HISTORY — DX: Pure hypercholesterolemia, unspecified: E78.00

## 2011-11-03 HISTORY — DX: Anxiety disorder, unspecified: F41.9

## 2011-11-03 HISTORY — DX: Essential (primary) hypertension: I10

## 2011-11-03 LAB — DIFFERENTIAL
Eosinophils Absolute: 0 10*3/uL (ref 0.0–0.7)
Lymphs Abs: 1.2 10*3/uL (ref 0.7–4.0)
Monocytes Absolute: 0.5 10*3/uL (ref 0.1–1.0)
Monocytes Relative: 7 % (ref 3–12)
Neutrophils Relative %: 78 % — ABNORMAL HIGH (ref 43–77)

## 2011-11-03 LAB — CBC
HCT: 41.3 % (ref 39.0–52.0)
Hemoglobin: 14.6 g/dL (ref 13.0–17.0)
MCH: 30.5 pg (ref 26.0–34.0)
MCHC: 35.4 g/dL (ref 30.0–36.0)
MCV: 86.2 fL (ref 78.0–100.0)
RBC: 4.79 MIL/uL (ref 4.22–5.81)

## 2011-11-03 LAB — BASIC METABOLIC PANEL
BUN: 8 mg/dL (ref 6–23)
Creatinine, Ser: 0.82 mg/dL (ref 0.50–1.35)
GFR calc non Af Amer: >90 mL/min (ref >90)
Glucose, Bld: 105 mg/dL — ABNORMAL HIGH (ref 70–99)
Potassium: 3.4 mEq/L — ABNORMAL LOW (ref 3.5–5.1)

## 2011-11-03 MED ORDER — ASPIRIN 81 MG PO CHEW
324.0000 mg | CHEWABLE_TABLET | Freq: Once | ORAL | Status: AC
  Administered 2011-11-03: 324 mg via ORAL
  Filled 2011-11-03: qty 4

## 2011-11-03 NOTE — ED Notes (Signed)
Pt has had chest pain intermit chest pain under lt breast , seen md for chest pain had ekg done and it was normal, the only thing that helps it was rest. Pain usally begins while he is sitting. No sob, no cough, anxious while in triage states that he is just nervous. No cardiac issues with family, pt does have a hx of htn.

## 2011-11-03 NOTE — ED Provider Notes (Signed)
History     CSN: [id number]  Arrival date & time 11/03/11  1308   First MD Initiated Contact with Patient 11/03/11 1413      Chief Complaint  Patient presents with  . Chest Pain    (Consider location/radiation/quality/duration/timing/severity/associated sxs/prior treatment) HPI  Patient presents to ER complaining of intermittent left sided chest discomfort. Patient states that he has been very stressed at his job lately and last week while sitting at his desk felt a aching left sided chest discomfort near left breast that he states was gradual onset with a dull constant ache into the next day and therefore he went to see his PCP and has EKG performed that he states "was normal." Patient notes that he was having mild chest discomfort during that visit but later that day it resolved. Since then patient states he has been sitting at his desk and will notice recurrence of left sided discomfort that will last minutes to hours x a few episodes. He denies associated fevers, chills, cough, SOB, diaphoresis, n/v, abdominal pain. Patient states that he takes a daily ASA but took two additional aspirin today when he noticed pain return without relief of pain. Patient states he has hx of HTN and high cholesterol that are both well controlled with medication. He denies alcohol, tobacco or illicit drug use. Patient states that he attempts to walk daily for exercise and has gone walking x 2 since first noticing pain last week without any pain with ambulating or exertion. He denies aggravating or alleviating factors. Patient denies family hx of early CAD or MI.   Past Medical History  Diagnosis Date  . Hypertension   . Hypercholesterolemia   . Anxiety     Past Surgical History  Procedure Date  . Hernia repair     No family history on file.  History  Substance Use Topics  . Smoking status: Not on file  . Smokeless tobacco: Not on file  . Alcohol Use:       Review of Systems  All other  systems reviewed and are negative.    Allergies  Review of patient's allergies indicates no known allergies.  Home Medications   Current Outpatient Rx  Name Route Sig Dispense Refill  . ASPIRIN EC 81 MG PO TBEC Oral Take 81 mg by mouth daily.    Marland Kitchen CLONAZEPAM 2 MG PO TABS Oral Take 2 mg by mouth 2 (two) times daily as needed. anxiety    . NEBIVOLOL HCL 2.5 MG PO TABS Oral Take 2.5 mg by mouth daily.    Marland Kitchen SIMVASTATIN 20 MG PO TABS Oral Take 20 mg by mouth at bedtime.      BP 116/79  Pulse 99  Temp(Src) 98.1 F (36.7 C) (Oral)  Resp 20  Ht 5\' 6"  (1.676 m)  Wt 225 lb (102.059 kg)  BMI 36.32 kg/m2  SpO2 97%  Physical Exam  Nursing note and vitals reviewed. Constitutional: He is oriented to person, place, and time. He appears well-developed and well-nourished. No distress.  HENT:  Head: Normocephalic and atraumatic.  Eyes: Conjunctivae are normal.  Neck: Normal range of motion. Neck supple.  Cardiovascular: Normal rate, regular rhythm, normal heart sounds and intact distal pulses.  Exam reveals no gallop and no friction rub.   No murmur heard. Pulmonary/Chest: Effort normal and breath sounds normal. No respiratory distress. He has no wheezes. He has no rales. He exhibits no tenderness.  Abdominal: Bowel sounds are normal. He exhibits no distension and no mass.  There is no tenderness. There is no rebound and no guarding.  Musculoskeletal: Normal range of motion. He exhibits no edema and no tenderness.  Neurological: He is alert and oriented to person, place, and time.  Skin: Skin is warm and dry. No rash noted. He is not diaphoretic. No erythema.  Psychiatric: He has a normal mood and affect.       Anxious appearing.     ED Course  Procedures (including critical care time)  PO aspirin   Date: 11/03/2011  Rate: 99  Rhythm: normal sinus rhythm  QRS Axis: normal, RSR' in V1 and V2  Intervals: normal  ST/T Wave abnormalities: normal  Conduction Disutrbances: none   Narrative Interpretation:   Old EKG Reviewed: non provocative compared to May 31, 2009. No significant changes noted     Labs Reviewed  DIFFERENTIAL - Abnormal; Notable for the following:    Neutrophils Relative 78 (*)    All other components within normal limits  CBC  POCT I-STAT TROPONIN I  BASIC METABOLIC PANEL   Dg Chest 2 View  11/03/2011  *RADIOLOGY REPORT*  Clinical Data: Left-sided chest pain  CHEST - 2 VIEW  Comparison: 04/04/2011  Findings: Heart size is normal.  Mediastinal shadows are normal. The lungs are clear.  The vascularity is normal.  No effusions. Mild curvature degenerative change effects the spine.  IMPRESSION: No active disease  Original Report Authenticated By: Thomasenia Sales, M.D.     1. Chest pain       MDM  Patient is low risk ACS with no acute findings on labs, chest xray or EKG. Low risk for PE and PERC negative.         Jenness Corner, [place] 11/03/11 1646

## 2011-11-03 NOTE — ED Provider Notes (Signed)
Medical screening examination/treatment/procedure(s) were conducted as a shared visit with non-physician practitioner(s) and myself.  I personally evaluated the patient during the encounter This generally well-appearing male presents with intermittent left sided chest pain.  On exam he is in no distress with unremarkable vital signs.  The case is well described by the physician assistant.  The patient was discharged in stable condition following a prolonged discussion with myself regarding the necessity for continued evaluation.  I have seen ECG and group interpretation.  I have reviewed the chest x-ray. Cardiac monitor 90 sinus rhythm normal Pulse oximetry 100% room air normal   Gerhard Munch, MD 11/03/11 2345

## 2011-11-03 NOTE — Discharge Instructions (Signed)
Follow up with [facility] Cardiology for further evaluation of recurrent chest discomfort but return to ER for emergent changing or worsening of symptoms.   Chest Pain (Nonspecific) It is often hard to give a specific diagnosis for the cause of chest pain. There is always a chance that your pain could be related to something serious, such as a heart attack or a blood clot in the lungs. You need to follow up with your caregiver for further evaluation. CAUSES   Heartburn.   Pneumonia or bronchitis.   Anxiety and stress.   Inflammation around your heart (pericarditis) or lung (pleuritis or pleurisy).   A blood clot in the lung.   A collapsed lung (pneumothorax). It can develop suddenly on its own (spontaneous pneumothorax) or from injury (trauma) to the chest.  The chest wall is composed of bones, muscles, and cartilage. Any of these can be the source of the pain.  The bones can be bruised by injury.   The muscles or cartilage can be strained by coughing or overwork.   The cartilage can be affected by inflammation and become sore (costochondritis).  DIAGNOSIS  Lab tests or other studies, such as X-rays, an EKG, stress testing, or cardiac imaging, may be needed to find the cause of your pain.  TREATMENT   Treatment depends on what may be causing your chest pain. Treatment may include:   Acid blockers for heartburn.   Anti-inflammatory medicine.   Pain medicine for inflammatory conditions.   Antibiotics if an infection is present.   You may be advised to change lifestyle habits. This includes stopping smoking and avoiding caffeine and chocolate.   You may be advised to keep your head raised (elevated) when sleeping. This reduces the chance of acid going backward from your stomach into your esophagus.   Most of the time, nonspecific chest pain will improve within 2 to 3 days with rest and mild pain medicine.  HOME CARE INSTRUCTIONS   If antibiotics were prescribed, take the full  amount even if you start to feel better.   For the next few days, avoid physical activities that bring on chest pain. Continue physical activities as directed.   Do not smoke cigarettes or drink alcohol until your symptoms are gone.   Only take over-the-counter or prescription medicine for pain, discomfort, or fever as directed by your caregiver.   Follow your caregiver's suggestions for further testing if your chest pain does not go away.   Keep any follow-up appointments you made. If you do not go to an appointment, you could develop lasting (chronic) problems with pain. If there is any problem keeping an appointment, you must call to reschedule.  SEEK MEDICAL CARE IF:   You think you are having problems from the medicine you are taking. Read your medicine instructions carefully.   Your chest pain does not go away, even after treatment.   You develop a rash with blisters on your chest.  SEEK IMMEDIATE MEDICAL CARE IF:   You have increased chest pain or pain that spreads to your arm, neck, jaw, back, or belly (abdomen).   You develop shortness of breath, an increasing cough, or you are coughing up blood.   You have severe back or abdominal pain, feel sick to your stomach (nauseous) or throw up (vomit).   You develop severe weakness, fainting, or chills.   You have an oral temperature above 102 F (38.9 C), not controlled by medicine.  THIS IS AN EMERGENCY. Do not wait to  see if the pain will go away. Get medical help at once. Call your local emergency services (911 in [place] Do not drive yourself to the hospital. MAKE SURE YOU:   Understand these instructions.   Will watch your condition.   Will get help right away if you are not doing well or get worse.  Document Released: 06/09/2005 Document Revised: 05/12/2011 Document Reviewed: 04/04/2008 [facility] Patient Information 2012 [place], [place].

## 2011-11-07 ENCOUNTER — Other Ambulatory Visit: Payer: Self-pay

## 2011-11-07 ENCOUNTER — Encounter (HOSPITAL_COMMUNITY): Payer: Self-pay | Admitting: Adult Health

## 2011-11-07 ENCOUNTER — Emergency Department (HOSPITAL_COMMUNITY)
Admission: EM | Admit: 2011-11-07 | Discharge: 2011-11-07 | Disposition: A | Discharge status: Home Health | Payer: BC Managed Care – PPO | Attending: Emergency Medicine | Admitting: Emergency Medicine

## 2011-11-07 ENCOUNTER — Emergency Department (HOSPITAL_COMMUNITY): Payer: BC Managed Care – PPO

## 2011-11-07 DIAGNOSIS — I10 Essential (primary) hypertension: Secondary | ICD-10-CM | POA: Insufficient documentation

## 2011-11-07 DIAGNOSIS — E7800 Pure hypercholesterolemia, unspecified: Secondary | ICD-10-CM | POA: Insufficient documentation

## 2011-11-07 DIAGNOSIS — F411 Generalized anxiety disorder: Secondary | ICD-10-CM | POA: Insufficient documentation

## 2011-11-07 DIAGNOSIS — R0602 Shortness of breath: Secondary | ICD-10-CM | POA: Insufficient documentation

## 2011-11-07 DIAGNOSIS — R079 Chest pain, unspecified: Secondary | ICD-10-CM | POA: Insufficient documentation

## 2011-11-07 LAB — POCT I-STAT TROPONIN I: Troponin i, poc: 0 ng/mL (ref 0.00–0.08)

## 2011-11-07 LAB — COMPREHENSIVE METABOLIC PANEL
BUN: 13 mg/dL (ref 6–23)
CO2: 24 mEq/L (ref 19–32)
Chloride: 99 mEq/L (ref 96–112)
Creatinine, Ser: 0.91 mg/dL (ref 0.50–1.35)
GFR calc Af Amer: >90 mL/min (ref >90)
GFR calc non Af Amer: >90 mL/min (ref >90)
Total Bilirubin: 0.1 mg/dL — ABNORMAL LOW (ref 0.3–1.2)

## 2011-11-07 LAB — CBC
HCT: 40.9 % (ref 39.0–52.0)
MCH: 30.5 pg (ref 26.0–34.0)
MCV: 86.7 fL (ref 78.0–100.0)
RBC: 4.72 MIL/uL (ref 4.22–5.81)
WBC: 9.8 10*3/uL (ref 4.0–10.5)

## 2011-11-07 LAB — D-DIMER, QUANTITATIVE: D-Dimer, Quant: 0.3 ug/mL-FEU (ref 0.00–0.48)

## 2011-11-07 MED ORDER — ASPIRIN 81 MG PO CHEW
324.0000 mg | CHEWABLE_TABLET | Freq: Once | ORAL | Status: AC
  Administered 2011-11-07: 324 mg via ORAL
  Filled 2011-11-07: qty 1

## 2011-11-07 MED ORDER — NITROGLYCERIN 0.4 MG SL SUBL
0.4000 mg | SUBLINGUAL_TABLET | SUBLINGUAL | Status: AC | PRN
  Administered 2011-11-07 (×3): 0.4 mg via SUBLINGUAL
  Filled 2011-11-07: qty 25

## 2011-11-07 NOTE — ED Provider Notes (Signed)
History     CSN: [id number]  Arrival date & time 11/07/11  1554   First MD Initiated Contact with Patient 11/07/11 1610      Chief Complaint  Patient presents with  . Chest Pain    (Consider location/radiation/quality/duration/timing/severity/associated sxs/prior treatment) HPI Patient is a 56 year old male who presents today complaining of left-sided chest pain. This began this morning while he was at work. Patient was not doing anything particularly strenuous. He indicates the pain is located along the left lower chest wall. He denies the radiation. He has associated shortness of breath appears anxious. He denies any associated nausea or vomiting. Patient was seen on Wednesday for similar and was discharged with plan to followup with cardiology. Patient cannot think of any mimics his pain better or worse. He has no family history of early coronary artery disease, he is nonsmoker, and he has no history of hyperlipidemia. Patient does have a history of hypertension is significantly hypertensive on presentation today. He denies recent coughs or fevers. He has no history of thromboembolic disease or family history of same. Patient has no risk factors such as prolonged immobility for this as well. There are no other associated or modifying factors. Past Medical History  Diagnosis Date  . Hypertension   . Hypercholesterolemia   . Anxiety     Past Surgical History  Procedure Date  . Hernia repair     History reviewed. No pertinent family history.  History  Substance Use Topics  . Smoking status: Not on file  . Smokeless tobacco: Not on file  . Alcohol Use:       Review of Systems  Constitutional: Negative.   HENT: Negative.   Eyes: Negative.   Respiratory: Negative.   Cardiovascular: Positive for chest pain.  Gastrointestinal: Negative.   Genitourinary: Negative.   Musculoskeletal: Negative.   Skin: Negative.   Neurological: Negative.   Hematological: Negative.     Psychiatric/Behavioral: Negative.   All other systems reviewed and are negative.    Allergies  Review of patient's allergies indicates no known allergies.  Home Medications   Current Outpatient Rx  Name Route Sig Dispense Refill  . ASPIRIN EC 81 MG PO TBEC Oral Take 81 mg by mouth daily.    Marland Kitchen CLONAZEPAM 2 MG PO TABS Oral Take 2 mg by mouth 2 (two) times daily as needed. anxiety    . NEBIVOLOL HCL 2.5 MG PO TABS Oral Take 2.5 mg by mouth daily.    Marland Kitchen SIMVASTATIN 20 MG PO TABS Oral Take 20 mg by mouth at bedtime.      BP 150/122  Pulse 95  Temp(Src) 97.9 F (36.6 C) (Oral)  Resp 16  Wt 216 lb (97.977 kg)  SpO2 97%  Physical Exam  Nursing note and vitals reviewed. GEN: Well-developed, well-nourished male in mild distress HEENT: Atraumatic, normocephalic. Oropharynx clear without erythema EYES: PERRLA BL, no scleral icterus. NECK: Trachea midline, no meningismus CV: regular rate and rhythm. No murmurs, rubs, or gallops PULM: No respiratory distress.  No crackles, wheezes, or rales. GI: soft, non-tender. No guarding, rebound, or tenderness. + bowel sounds  Neuro: cranial nerves 2-12 intact, no abnormalities of strength or sensation, A and O x 3 MSK: Patient moves all 4 extremities symmetrically, no deformity, edema, or injury noted Psych: Very anxious appearing   ED Course  Procedures (including critical care time)   Date: 11/07/2011  Rate: 97  Rhythm: normal sinus rhythm  QRS Axis: normal  Intervals: normal  ST/T Wave abnormalities:  nonspecific T wave changes  Conduction Disutrbances:none  Narrative Interpretation: Patient with slight morphology changes QRS in lead 3. No acute ischemic changes.  Old EKG Reviewed: changes noted   Labs Reviewed  COMPREHENSIVE METABOLIC PANEL - Abnormal; Notable for the following:    Glucose, Bld 107 (*)    Total Bilirubin 0.1 (*)    All other components within normal limits  CBC  D-DIMER, QUANTITATIVE  POCT I-STAT TROPONIN I   CARDIAC PANEL(CRET KIN+CKTOT+MB+TROPI)  LAB REPORT - SCANNED   Dg Chest 2 View  11/07/2011  *RADIOLOGY REPORT*  Clinical Data: Chest pain.  CHEST - 2 VIEW  Comparison: 11/03/2011  Findings: Minimal bibasilar scarring noted.  The lungs appear otherwise clear. Cardiac and mediastinal contours appear unremarkable.  Mild thoracic spondylosis is present.  IMPRESSION:  1.  Stable mild bibasilar scarring.  No acute findings.  Original Report Authenticated By: Dellia Cloud, M.D.     1. Chest pain       MDM  Patient returns today with with return of his chest pain he was evaluated for previously earlier in the week. Today patient is hypertensive with no acute ischemic changes noted on his EKG. Laboratory workup for possible ACS was initiated. Patient also had a d-dimer added to his workup at this time. I do for his very low risk for thromboembolic disease but felt this was worse evaluating for as well given patient's return visit in such a brief time. Patient was given aspirin as well as nitroglycerin for his symptoms.  Patient had normal work-up including negative 0 and 3 hour markers, d-dimer, CXR, ECG, CBC, and BMP.  Patient was feeling much better and was discharged in good condition with instructions to follow-up as previously planned for outpatient stress test with cardiology.        Cyndra Numbers, MD 11/08/11 2240

## 2011-11-07 NOTE — Discharge Instructions (Signed)
Chest Pain (Nonspecific) It is often hard to give a specific diagnosis for the cause of chest pain. There is always a chance that your pain could be related to something serious, such as a heart attack or a blood clot in the lungs. You need to follow up with your caregiver for further evaluation. CAUSES   Heartburn.   Pneumonia or bronchitis.   Anxiety and stress.   Inflammation around your heart (pericarditis) or lung (pleuritis or pleurisy).   A blood clot in the lung.   A collapsed lung (pneumothorax). It can develop suddenly on its own (spontaneous pneumothorax) or from injury (trauma) to the chest.  The chest wall is composed of bones, muscles, and cartilage. Any of these can be the source of the pain.  The bones can be bruised by injury.   The muscles or cartilage can be strained by coughing or overwork.   The cartilage can be affected by inflammation and become sore (costochondritis).  DIAGNOSIS  Lab tests or other studies, such as X-rays, an EKG, stress testing, or cardiac imaging, may be needed to find the cause of your pain.  TREATMENT   Treatment depends on what may be causing your chest pain. Treatment may include:   Acid blockers for heartburn.   Anti-inflammatory medicine.   Pain medicine for inflammatory conditions.   Antibiotics if an infection is present.   You may be advised to change lifestyle habits. This includes stopping smoking and avoiding caffeine and chocolate.   You may be advised to keep your head raised (elevated) when sleeping. This reduces the chance of acid going backward from your stomach into your esophagus.   Most of the time, nonspecific chest pain will improve within 2 to 3 days with rest and mild pain medicine.  HOME CARE INSTRUCTIONS   If antibiotics were prescribed, take the full amount even if you start to feel better.   For the next few days, avoid physical activities that bring on chest pain. Continue physical activities as  directed.   Do not smoke cigarettes or drink alcohol until your symptoms are gone.   Only take over-the-counter or prescription medicine for pain, discomfort, or fever as directed by your caregiver.   Follow your caregiver's suggestions for further testing if your chest pain does not go away.   Keep any follow-up appointments you made. If you do not go to an appointment, you could develop lasting (chronic) problems with pain. If there is any problem keeping an appointment, you must call to reschedule.  SEEK MEDICAL CARE IF:   You think you are having problems from the medicine you are taking. Read your medicine instructions carefully.   Your chest pain does not go away, even after treatment.   You develop a rash with blisters on your chest.  SEEK IMMEDIATE MEDICAL CARE IF:   You have increased chest pain or pain that spreads to your arm, neck, jaw, back, or belly (abdomen).   You develop shortness of breath, an increasing cough, or you are coughing up blood.   You have severe back or abdominal pain, feel sick to your stomach (nauseous) or throw up (vomit).   You develop severe weakness, fainting, or chills.   You have an oral temperature above 102 F (38.9 C), not controlled by medicine.  THIS IS AN EMERGENCY. Do not wait to see if the pain will go away. Get medical help at once. Call your local emergency services (911 in [place] Do not drive yourself to   the hospital. MAKE SURE YOU:   Understand these instructions.   Will watch your condition.   Will get help right away if you are not doing well or get worse.  Document Released: 06/09/2005 Document Revised: 05/12/2011 Document Reviewed: 04/04/2008 [employer] Patient Information 2012 [employer]. 

## 2011-11-07 NOTE — ED Notes (Addendum)
C/o chest pain on the left side that began about one hour ago and is described as sharp and constant. Denies nausea and SOB. Pain does not worsen with inspiration.  EKG done. Pt is hypertensive 150/122

## 2011-12-11 ENCOUNTER — Other Ambulatory Visit: Payer: Self-pay

## 2011-12-11 ENCOUNTER — Emergency Department (HOSPITAL_COMMUNITY)
Admission: EM | Admit: 2011-12-11 | Discharge: 2011-12-11 | Disposition: A | Discharge status: Home / Self Care | Payer: BC Managed Care – PPO | Attending: Emergency Medicine | Admitting: Emergency Medicine

## 2011-12-11 ENCOUNTER — Encounter (HOSPITAL_COMMUNITY): Payer: Self-pay | Admitting: Emergency Medicine

## 2011-12-11 ENCOUNTER — Emergency Department (HOSPITAL_COMMUNITY): Payer: BC Managed Care – PPO

## 2011-12-11 DIAGNOSIS — F411 Generalized anxiety disorder: Secondary | ICD-10-CM | POA: Insufficient documentation

## 2011-12-11 DIAGNOSIS — R4182 Altered mental status, unspecified: Secondary | ICD-10-CM | POA: Insufficient documentation

## 2011-12-11 DIAGNOSIS — Z79899 Other long term (current) drug therapy: Secondary | ICD-10-CM | POA: Insufficient documentation

## 2011-12-11 DIAGNOSIS — K219 Gastro-esophageal reflux disease without esophagitis: Secondary | ICD-10-CM | POA: Insufficient documentation

## 2011-12-11 DIAGNOSIS — I10 Essential (primary) hypertension: Secondary | ICD-10-CM | POA: Insufficient documentation

## 2011-12-11 DIAGNOSIS — R110 Nausea: Secondary | ICD-10-CM | POA: Insufficient documentation

## 2011-12-11 DIAGNOSIS — H11419 Vascular abnormalities of conjunctiva, unspecified eye: Secondary | ICD-10-CM | POA: Insufficient documentation

## 2011-12-11 LAB — BASIC METABOLIC PANEL
BUN: 13 mg/dL (ref 6–23)
Chloride: 103 mEq/L (ref 96–112)
Creatinine, Ser: 0.98 mg/dL (ref 0.50–1.35)
GFR calc Af Amer: >90 mL/min (ref >90)
GFR calc non Af Amer: >90 mL/min (ref >90)
Glucose, Bld: 87 mg/dL (ref 70–99)
Potassium: 3.7 mEq/L (ref 3.5–5.1)

## 2011-12-11 LAB — CBC
HCT: 40.9 % (ref 39.0–52.0)
Hemoglobin: 14.2 g/dL (ref 13.0–17.0)
MCH: 30.3 pg (ref 26.0–34.0)
MCHC: 34.7 g/dL (ref 30.0–36.0)
RBC: 4.68 MIL/uL (ref 4.22–5.81)

## 2011-12-11 LAB — DIFFERENTIAL
Basophils Relative: 0 % (ref 0–1)
Eosinophils Absolute: 0.1 10*3/uL (ref 0.0–0.7)
Lymphs Abs: 1.1 10*3/uL (ref 0.7–4.0)
Monocytes Absolute: 0.6 10*3/uL (ref 0.1–1.0)
Monocytes Relative: 6 % (ref 3–12)

## 2011-12-11 LAB — POCT I-STAT TROPONIN I

## 2011-12-11 MED ORDER — LORAZEPAM 2 MG/ML IJ SOLN
0.5000 mg | Freq: Once | INTRAMUSCULAR | Status: AC
  Administered 2011-12-11: 0.5 mg via INTRAVENOUS
  Filled 2011-12-11: qty 1

## 2011-12-11 MED ORDER — FAMOTIDINE 20 MG PO TABS: 20.0000 mg | ORAL_TABLET | Freq: Two times a day (BID) | ORAL | Status: DC

## 2011-12-11 MED ORDER — GI COCKTAIL ~~LOC~~
30.0000 mL | Freq: Once | ORAL | Status: AC
  Administered 2011-12-11: 30 mL via ORAL
  Filled 2011-12-11: qty 30

## 2011-12-11 NOTE — Discharge Instructions (Signed)
Take Pepcid as directed for indigestion and acid reflux and follow up with primary care provider as needed for ongoing evaluation of indigestion but return to ER for changing or worsening of symptoms.   Gastroesophageal Reflux Disease, Adult Gastroesophageal reflux disease (GERD) happens when acid from your stomach flows up into the esophagus. When acid comes in contact with the esophagus, the acid causes soreness (inflammation) in the esophagus. Over time, GERD may create small holes (ulcers) in the lining of the esophagus. CAUSES   Increased body weight. This puts pressure on the stomach, making acid rise from the stomach into the esophagus.   Smoking. This increases acid production in the stomach.   Drinking alcohol. This causes decreased pressure in the lower esophageal sphincter (valve or ring of muscle between the esophagus and stomach), allowing acid from the stomach into the esophagus.   Late evening meals and a full stomach. This increases pressure and acid production in the stomach.   A malformed lower esophageal sphincter.  Sometimes, no cause is found. SYMPTOMS   Burning pain in the lower part of the mid-chest behind the breastbone and in the mid-stomach area. This may occur twice a week or more often.   Trouble swallowing.   Sore throat.   Dry cough.   Asthma-like symptoms including chest tightness, shortness of breath, or wheezing.  DIAGNOSIS  Your caregiver may be able to diagnose GERD based on your symptoms. In some cases, X-rays and other tests may be done to check for complications or to check the condition of your stomach and esophagus. TREATMENT  Your caregiver may recommend over-the-counter or prescription medicines to help decrease acid production. Ask your caregiver before starting or adding any new medicines.  HOME CARE INSTRUCTIONS   Change the factors that you can control. Ask your caregiver for guidance concerning weight loss, quitting smoking, and alcohol  consumption.   Avoid foods and drinks that make your symptoms worse, such as:   Caffeine or alcoholic drinks.   Chocolate.   Peppermint or mint flavorings.   Garlic and onions.   Spicy foods.   Citrus fruits, such as oranges, lemons, or limes.   Tomato-based foods such as sauce, chili, salsa, and pizza.   Fried and fatty foods.   Avoid lying down for the 3 hours prior to your bedtime or prior to taking a nap.   Eat small, frequent meals instead of large meals.   Wear loose-fitting clothing. Do not wear anything tight around your waist that causes pressure on your stomach.   Raise the head of your bed 6 to 8 inches with wood blocks to help you sleep. Extra pillows will not help.   Only take over-the-counter or prescription medicines for pain, discomfort, or fever as directed by your caregiver.   Do not take aspirin, ibuprofen, or other nonsteroidal anti-inflammatory drugs (NSAIDs).  SEEK IMMEDIATE MEDICAL CARE IF:   You have pain in your arms, neck, jaw, teeth, or back.   Your pain increases or changes in intensity or duration.   You develop nausea, vomiting, or sweating (diaphoresis).   You develop shortness of breath, or you faint.   Your vomit is green, yellow, black, or looks like coffee grounds or blood.   Your stool is red, bloody, or black.  These symptoms could be signs of other problems, such as heart disease, gastric bleeding, or esophageal bleeding. MAKE SURE YOU:   Understand these instructions.   Will watch your condition.   Will get help right away  if you are not doing well or get worse.  Document Released: 06/09/2005 Document Revised: 08/19/2011 Document Reviewed: 03/19/2011 [facility] Patient Information 2012 [place], [place].  Diet for GERD or PUD Nutrition therapy can help ease the discomfort of gastroesophageal reflux disease (GERD) and peptic ulcer disease (PUD).  HOME CARE INSTRUCTIONS   Eat your meals slowly, in a relaxed setting.   Eat 5  to 6 small meals per day.   If a food causes distress, stop eating it for a period of time.  FOODS TO AVOID  Coffee, regular or decaffeinated.   Cola beverages, regular or low calorie.   Tea, regular or decaffeinated.   Pepper.   Cocoa.   High fat foods, including meats.   Butter, margarine, hydrogenated oil (trans fats).   Peppermint or spearmint (if you have GERD).   Fruits and vegetables if not tolerated.   Alcohol.   Nicotine (smoking or chewing). This is one of the most potent stimulants to acid production in the gastrointestinal tract.   Any food that seems to aggravate your condition.  If you have questions regarding your diet, ask your caregiver or a registered dietitian. TIPS  Lying flat may make symptoms worse. Keep the head of your bed raised 6 to 9 inches (15 to 23 cm) by using a foam wedge or blocks under the legs of the bed.   Do not lay down until 3 hours after eating a meal.   Daily physical activity may help reduce symptoms.  MAKE SURE YOU:   Understand these instructions.   Will watch your condition.   Will get help right away if you are not doing well or get worse.  Document Released: 08/30/2005 Document Revised: 08/19/2011 Document Reviewed: 07/16/2011 [facility] Patient Information 2012 [place], [place].

## 2011-12-11 NOTE — ED Notes (Signed)
Patient transported to X-ray 

## 2011-12-11 NOTE — ED Provider Notes (Signed)
History     CSN: [id number]  Arrival date & time 12/11/11  1402   First MD Initiated Contact with Patient 12/11/11 1403      Chief Complaint  Patient presents with  . Altered Mental Status    (Consider location/radiation/quality/duration/timing/severity/associated sxs/prior treatment) HPI  Patient presents to emergency department by EMS from a [facility] with concern from the urgent care per EMS that patient was acting confused with complaints of "indigestion." Per patient, he began to have what he describes as indigestion yesterday afternoon after eating at [employer] but states that this indigestion is very much related to his anxiety and he describes as "nervous stomach." Patient states the indigestion is a nauseated sensation in his epigastric region. Patient states he's had multiple episodes of similar discomfort in the past. Patient denies any associated diaphoresis, shortness of breath, or chest pain. Patient states he took Pepto-Bismol and the symptoms resolved. Patient states that he woke this morning he felt "nervous stomach and indigestion again" and therefore went a [facility] to be evaluated. Patient states once he got to the [facility] they found his blood pressure to be a "little low" at 92/68 and staff were concerned that he was "not thinking clearly". Patient states he feels like he is thinking fine he is alert and oriented to himself, place, and time. Patient states he had an episode of loose stool this morning but denies any vomiting. He denies any point specific abdominal pain but states "just nauseous anxious feeling in my stomach." Patient has been evaluated in the emergency room twice in February of 2013 for evaluation of chest pain. Both ER visits had negative troponins and negative cardiac workup. Patient states he has history of elevated blood pressure, cholesterol, and anxiety. Patient states that over the last few weeks he's felt that his depression has  worsened and therefore was evaluated by his psychiatrist and was started on Paxil 3 weeks ago. Patient states he is unsure whether or not the Paxil has help yet with depression. He denies suicidal or homicidal ideation. He denies any recent illness. Denies aggravating or alleviating factors. Patient is lying comfortably in bed stating that indigestion has resolved without complaint.  Patient once again mentions that he is in a high stress job as noted in last two ER visits but states that he was off work yesterday and "did not feel like I should have been anxious about anything."   Past Medical History  Diagnosis Date  . Hypertension   . Hypercholesterolemia   . Anxiety     Past Surgical History  Procedure Date  . Hernia repair     No family history on file.  History  Substance Use Topics  . Smoking status: Not on file  . Smokeless tobacco: Not on file  . Alcohol Use:       Review of Systems  All other systems reviewed and are negative.    Allergies  Review of patient's allergies indicates no known allergies.  Home Medications   Current Outpatient Rx  Name Route Sig Dispense Refill  . ASPIRIN EC 81 MG PO TBEC Oral Take 81 mg by mouth daily.    Marland Kitchen CLONAZEPAM 2 MG PO TABS Oral Take 2 mg by mouth 2 (two) times daily as needed. anxiety    . NEBIVOLOL HCL 2.5 MG PO TABS Oral Take 2.5 mg by mouth daily.    Marland Kitchen SIMVASTATIN 20 MG PO TABS Oral Take 20 mg by mouth at bedtime.  BP 113/68  Pulse 101  Temp(Src) 98.5 F (36.9 C) (Oral)  Resp 16  SpO2 96%  Physical Exam  Nursing note and vitals reviewed. Constitutional: He is oriented to person, place, and time. He appears well-developed and well-nourished. No distress.  HENT:  Head: Normocephalic and atraumatic.  Eyes: EOM are normal. Pupils are equal, round, and reactive to light. Right eye exhibits no discharge. Left eye exhibits no discharge. Right conjunctiva is injected. Right conjunctiva has no hemorrhage. Left  conjunctiva is injected. Left conjunctiva has no hemorrhage.  Neck: Normal range of motion. Neck supple.  Cardiovascular: Normal rate, regular rhythm, normal heart sounds and intact distal pulses.  Exam reveals no gallop and no friction rub.   No murmur heard. Pulmonary/Chest: Effort normal and breath sounds normal. No respiratory distress. He has no wheezes. He has no rales. He exhibits no tenderness.  Abdominal: Soft. Bowel sounds are normal. He exhibits no distension and no mass. There is no tenderness. There is no rebound and no guarding.  Musculoskeletal: Normal range of motion. He exhibits no edema and no tenderness.  Neurological: He is alert and oriented to person, place, and time. No cranial nerve deficit. Coordination normal.  Skin: Skin is warm and dry. No rash noted. He is not diaphoretic. No erythema.  Psychiatric: He has a normal mood and affect.    ED Course  Procedures (including critical care time)  IV zofran and ativan. GI cocktail  Patient's wife is now at bedside and states that [facility] called her and when she got to office she was confused to why [facility] was concerned because she did and does not believe that her husband was or is altered. She states that he was going up to [facility] for "upset stomach" which is similar hx from husband.    Date: 12/11/2011  Rate: 97  Rhythm: normal sinus rhythm  QRS Axis: normal  Intervals: normal  ST/T Wave abnormalities: normal  Conduction Disutrbances:none  Narrative Interpretation: non provocative EKG compared to Nov 07, 2011  Old EKG Reviewed: unchanged   Labs Reviewed  DIFFERENTIAL - Abnormal; Notable for the following:    Neutrophils Relative 80 (*)    All other components within normal limits  CBC  POCT I-STAT TROPONIN I  BASIC METABOLIC PANEL  ETHANOL  URINE RAPID DRUG SCREEN (HOSP PERFORMED)   Dg Chest 2 View  12/11/2011  *RADIOLOGY REPORT*  Clinical Data: Indigestion.  CHEST - 2 VIEW  Comparison: Chest x-ray to  [id number].  Findings: Lung volumes are low.  No consolidative airspace disease. Linear opacity at the left base, favored to represent an area of subsegmental atelectasis.  No pleural effusions.  Pulmonary vasculature is normal.  Heart size and mediastinal contours are within normal limits.  IMPRESSION: 1.  Low lung volumes with probable subsegmental atelectasis at the left base.  No radiographic evidence of acute cardiopulmonary disease.  Original Report Authenticated By: Florencia Reasons, M.D.     1. GERD (gastroesophageal reflux disease)       MDM  Patient has been alert and oriented throughout ER stay. He has not been altered he has been speaking in complete sentences with comprehensible speech and mentating well. Patient complains of resolved symptoms that he describes as indigestion but denies any associated chest pain has had recent negative cardiac workups. No acute findings on labs.        Jenness Corner, [place] 12/11/11 1554

## 2011-12-11 NOTE — ED Provider Notes (Signed)
Medical screening examination/treatment/procedure(s) were conducted as a shared visit with non-physician practitioner(s) and myself.  I personally evaluated the patient during the encounter  Spoke with patient at length about today's episode. No concern for CVA or TIA. No concern for altered mental status. Patient feels that his baseline he'll be discharged home  Toy Baker, MD 12/11/11 1547

## 2011-12-11 NOTE — ED Notes (Signed)
Yesterday, starting having gi problems after eating at [employer] - took pepto bismol and sx. Subsided. This morning - doesn't know why and went to [facility] and he was hypotensive 92/68. Received 100 cc ns. cbg 87. Not alert to situation.

## 2011-12-12 NOTE — ED Provider Notes (Signed)
Medical screening examination/treatment/procedure(s) were conducted as a shared visit with non-physician practitioner(s) and myself.  I personally evaluated the patient during the encounter  Bobetta Korf T Ashlay Altieri, MD 12/12/11 1531 

## 2012-01-12 ENCOUNTER — Emergency Department (HOSPITAL_COMMUNITY)
Admission: EM | Admit: 2012-01-12 | Discharge: 2012-01-12 | Disposition: A | Discharge status: Home / Self Care | Payer: BC Managed Care – PPO | Attending: Emergency Medicine | Admitting: Emergency Medicine

## 2012-01-12 ENCOUNTER — Encounter (HOSPITAL_COMMUNITY): Payer: Self-pay | Admitting: *Deleted

## 2012-01-12 DIAGNOSIS — I10 Essential (primary) hypertension: Secondary | ICD-10-CM | POA: Insufficient documentation

## 2012-01-12 DIAGNOSIS — Z79899 Other long term (current) drug therapy: Secondary | ICD-10-CM | POA: Insufficient documentation

## 2012-01-12 DIAGNOSIS — E7800 Pure hypercholesterolemia, unspecified: Secondary | ICD-10-CM | POA: Insufficient documentation

## 2012-01-12 DIAGNOSIS — R42 Dizziness and giddiness: Secondary | ICD-10-CM | POA: Insufficient documentation

## 2012-01-12 LAB — COMPREHENSIVE METABOLIC PANEL
BUN: <3 mg/dL — ABNORMAL LOW (ref 6–23)
CO2: 25 mEq/L (ref 19–32)
Calcium: 8.1 mg/dL — ABNORMAL LOW (ref 8.4–10.5)
Chloride: 98 mEq/L (ref 96–112)
Creatinine, Ser: 0.62 mg/dL (ref 0.50–1.35)
GFR calc Af Amer: >90 mL/min (ref >90)
GFR calc non Af Amer: >90 mL/min (ref >90)
Glucose, Bld: 116 mg/dL — ABNORMAL HIGH (ref 70–99)
Total Bilirubin: 0.2 mg/dL — ABNORMAL LOW (ref 0.3–1.2)

## 2012-01-12 LAB — CBC
HCT: 39.3 % (ref 39.0–52.0)
Hemoglobin: 13.5 g/dL (ref 13.0–17.0)
MCV: 86.2 fL (ref 78.0–100.0)
RDW: 13.1 % (ref 11.5–15.5)
WBC: 3.9 10*3/uL — ABNORMAL LOW (ref 4.0–10.5)

## 2012-01-12 LAB — DIFFERENTIAL
Eosinophils Relative: 1 % (ref 0–5)
Lymphocytes Relative: 19 % (ref 12–46)
Monocytes Absolute: 0.6 10*3/uL (ref 0.1–1.0)
Monocytes Relative: 15 % — ABNORMAL HIGH (ref 3–12)
Neutro Abs: 2.5 10*3/uL (ref 1.7–7.7)

## 2012-01-12 MED ORDER — SODIUM CHLORIDE 0.9 % IV SOLN
Freq: Once | INTRAVENOUS | Status: AC
  Administered 2012-01-12: 20:00:00 via INTRAVENOUS

## 2012-01-12 MED ORDER — SODIUM CHLORIDE 0.9 % IV BOLUS (SEPSIS)
1000.0000 mL | Freq: Once | INTRAVENOUS | Status: AC
  Administered 2012-01-12: 1000 mL via INTRAVENOUS

## 2012-01-12 MED ORDER — POTASSIUM CHLORIDE CRYS ER 20 MEQ PO TBCR
40.0000 meq | EXTENDED_RELEASE_TABLET | Freq: Once | ORAL | Status: AC
  Administered 2012-01-12: 40 meq via ORAL
  Filled 2012-01-12: qty 2

## 2012-01-12 NOTE — ED Notes (Signed)
Pt reports lightheadedness x 3 days ago. Has been drinking fluids without relief.  Pt reports the lightheadedness is intermittent.  Pt denies any cp or SOB at this time.

## 2012-01-12 NOTE — ED Provider Notes (Addendum)
History     CSN: [id number]  Arrival date & time 01/12/12  1659   First MD Initiated Contact with Patient 01/12/12 1714      Chief Complaint  Patient presents with  . Dizziness    (Consider location/radiation/quality/duration/timing/severity/associated sxs/prior treatment) The history is provided by the patient.   Patient here with dizziness is worse with standing and better with lying flat. History of similar symptoms last year was diagnosed with hyponatremia due to his psychiatric meds. Patient denies any fever or vomiting or chest pain. No black or bloody stools. No medications taken recently. Past Medical History  Diagnosis Date  . Hypertension   . Hypercholesterolemia   . Anxiety     Past Surgical History  Procedure Date  . Hernia repair     No family history on file.  History  Substance Use Topics  . Smoking status: Never Smoker   . Smokeless tobacco: Not on file  . Alcohol Use: No      Review of Systems  All other systems reviewed and are negative.    Allergies  Review of patient's allergies indicates no known allergies.  Home Medications   Current Outpatient Rx  Name Route Sig Dispense Refill  . ASPIRIN EC 81 MG PO TBEC Oral Take 81 mg by mouth at bedtime.     Marland Kitchen CLONAZEPAM 2 MG PO TABS Oral Take 2 mg by mouth 2 (two) times daily as needed. anxiety    . FAMOTIDINE 20 MG PO TABS Oral Take 1 tablet (20 mg total) by mouth 2 (two) times daily. 30 tablet 0  . NEBIVOLOL HCL 2.5 MG PO TABS Oral Take 2.5 mg by mouth at bedtime.     Marland Kitchen SIMVASTATIN 20 MG PO TABS Oral Take 20 mg by mouth at bedtime.      BP 137/84  Pulse 105  Temp(Src) 99.6 F (37.6 C) (Oral)  Resp 20  SpO2 97%  Physical Exam  Nursing note and vitals reviewed. Constitutional: He is oriented to person, place, and time. He appears well-developed and well-nourished.  Non-toxic appearance. No distress.  HENT:  Head: Normocephalic and atraumatic.  Eyes: Conjunctivae, EOM and lids are  normal. Pupils are equal, round, and reactive to light.  Neck: Normal range of motion. Neck supple. No tracheal deviation present. No mass present.  Cardiovascular: Normal rate, regular rhythm and normal heart sounds.  Exam reveals no gallop.   No murmur heard. Pulmonary/Chest: Effort normal and breath sounds normal. No stridor. No respiratory distress. He has no decreased breath sounds. He has no wheezes. He has no rhonchi. He has no rales.  Abdominal: Soft. Normal appearance and bowel sounds are normal. He exhibits no distension. There is no tenderness. There is no rebound and no CVA tenderness.  Musculoskeletal: Normal range of motion. He exhibits no edema and no tenderness.  Neurological: He is alert and oriented to person, place, and time. He has normal strength. No cranial nerve deficit or sensory deficit. GCS eye subscore is 4. GCS verbal subscore is 5. GCS motor subscore is 6.  Skin: Skin is warm and dry. No abrasion and no rash noted.  Psychiatric: He has a normal mood and affect. His speech is normal and behavior is normal.    ED Course  Procedures (including critical care time)   Labs Reviewed  CBC  DIFFERENTIAL  COMPREHENSIVE METABOLIC PANEL   No results found.   No diagnosis found.    MDM   Date: 01/12/2012  Rate: 88  Rhythm:  normal sinus rhythm  QRS Axis: normal  Intervals: normal  ST/T Wave abnormalities: normal  Conduction Disutrbances:none  Narrative Interpretation:   Old EKG Reviewed: unchanged   9:10 PM Pt given iv fluids and potassium--feels better       Toy Baker, MD 01/12/12 2112  Toy Baker, MD 01/12/12 2112

## 2012-01-12 NOTE — Discharge Instructions (Signed)

## 2012-01-12 NOTE — ED Notes (Signed)
Pt now with no dizziness, pt education provided

## 2013-01-13 ENCOUNTER — Ambulatory Visit (INDEPENDENT_AMBULATORY_CARE_PROVIDER_SITE_OTHER): Payer: Commercial Managed Care - PPO | Admitting: Family Medicine

## 2013-01-13 ENCOUNTER — Ambulatory Visit: Payer: Commercial Managed Care - PPO

## 2013-01-13 VITALS — BP 125/78 | HR 80 | Temp 97.6°F | Resp 16 | Ht 66.0 in | Wt 194.4 lb

## 2013-01-13 DIAGNOSIS — M549 Dorsalgia, unspecified: Secondary | ICD-10-CM

## 2013-01-13 DIAGNOSIS — R079 Chest pain, unspecified: Secondary | ICD-10-CM

## 2013-01-13 NOTE — Progress Notes (Signed)
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  He has significant anxiety as well.  He goes to [employer] [facility] [place].    He was evaluated with EKG several weeks ago.    He saw his PCP on Thursday, developed significant back pain between shoulder blades on Friday (yesterday) and left chest pains today.  The quality of the chest pains somewhat sharp.   F/Hx:  Parents are deceased -alzheimers and prostate ca.  Sig Negs:  No shortness of breath, fever, nausea, diaphoresis, no smoking  Sig positives;  hyperlipidemia   Objective:  NAD, patient hesitant to answer questions without  Looking at wife for support  Color good HEENT:  Unremarkable Chest:  Clear, no rub Heart:  Regular, no murmur or rub Ext:  No  Calf tenderness or edema.  [facility] reading (PRIMARY) by  Dr. Milus Glazier:  CXR no infiltrates, normal cardiac silhouette, no effusions.  Ekg:  Normal  Assessment:  Very reassuring ekg and cxr.  I suspect the symptoms are secondary to stress and anxiety.    Plan:  Reassured  Stop meloxicam  Signed,  Elvina Sidle, MD

## 2013-01-13 NOTE — Addendum Note (Signed)
Addended by: Elvina Sidle on: 01/13/2013 06:45 PM   Modules accepted: Level of Service

## 2013-01-13 NOTE — Patient Instructions (Addendum)
Chest Pain (Nonspecific) It is often hard to give a specific diagnosis for the cause of chest pain. There is always a chance that your pain could be related to something serious, such as a heart attack or a blood clot in the lungs. You need to follow up with your caregiver for further evaluation. CAUSES   Heartburn.  Pneumonia or bronchitis.  Anxiety or stress.  Inflammation around your heart (pericarditis) or lung (pleuritis or pleurisy).  A blood clot in the lung.  A collapsed lung (pneumothorax). It can develop suddenly on its own (spontaneous pneumothorax) or from injury (trauma) to the chest.  Shingles infection (herpes zoster virus). The chest wall is composed of bones, muscles, and cartilage. Any of these can be the source of the pain.  The bones can be bruised by injury.  The muscles or cartilage can be strained by coughing or overwork.  The cartilage can be affected by inflammation and become sore (costochondritis). DIAGNOSIS  Lab tests or other studies, such as X-rays, electrocardiography, stress testing, or cardiac imaging, may be needed to find the cause of your pain.  TREATMENT   Treatment depends on what may be causing your chest pain. Treatment may include:  Acid blockers for heartburn.  Anti-inflammatory medicine.  Pain medicine for inflammatory conditions.  Antibiotics if an infection is present.  You may be advised to change lifestyle habits. This includes stopping smoking and avoiding alcohol, caffeine, and chocolate.  You may be advised to keep your head raised (elevated) when sleeping. This reduces the chance of acid going backward from your stomach into your esophagus.  Most of the time, nonspecific chest pain will improve within 2 to 3 days with rest and mild pain medicine. HOME CARE INSTRUCTIONS   If antibiotics were prescribed, take your antibiotics as directed. Finish them even if you start to feel better.  For the next few days, avoid physical  activities that bring on chest pain. Continue physical activities as directed.  Do not smoke.  Avoid drinking alcohol.  Only take over-the-counter or prescription medicine for pain, discomfort, or fever as directed by your caregiver.  Follow your caregiver's suggestions for further testing if your chest pain does not go away.  Keep any follow-up appointments you made. If you do not go to an appointment, you could develop lasting (chronic) problems with pain. If there is any problem keeping an appointment, you must call to reschedule. SEEK MEDICAL CARE IF:   You think you are having problems from the medicine you are taking. Read your medicine instructions carefully.  Your chest pain does not go away, even after treatment.  You develop a rash with blisters on your chest. SEEK IMMEDIATE MEDICAL CARE IF:   You have increased chest pain or pain that spreads to your arm, neck, jaw, back, or abdomen.  You develop shortness of breath, an increasing cough, or you are coughing up blood.  You have severe back or abdominal pain, feel nauseous, or vomit.  You develop severe weakness, fainting, or chills.  You have a fever. THIS IS AN EMERGENCY. Do not wait to see if the pain will go away. Get medical help at once. Call your local emergency services (911 in [place] Do not drive yourself to the hospital. MAKE SURE YOU:   Understand these instructions.  Will watch your condition.  Will get help right away if you are not doing well or get worse. Document Released: 06/09/2005 Document Revised: 11/22/2011 Document Reviewed: 04/04/2008 Exit[employer] Patient Information 2013 [employer].  

## 2013-03-12 ENCOUNTER — Other Ambulatory Visit: Payer: Self-pay | Admitting: Neurology

## 2013-03-12 DIAGNOSIS — R6889 Other general symptoms and signs: Secondary | ICD-10-CM

## 2013-03-23 ENCOUNTER — Ambulatory Visit
Admission: RE | Admit: 2013-03-23 | Discharge: 2013-03-23 | Disposition: A | Discharge status: Home / Self Care | Payer: Commercial Managed Care - PPO | Source: Ambulatory Visit | Attending: Neurology | Admitting: Neurology

## 2013-03-23 DIAGNOSIS — R6889 Other general symptoms and signs: Secondary | ICD-10-CM

## 2013-04-02 DIAGNOSIS — R413 Other amnesia: Secondary | ICD-10-CM

## 2013-04-02 DIAGNOSIS — F411 Generalized anxiety disorder: Secondary | ICD-10-CM

## 2013-04-10 DIAGNOSIS — F411 Generalized anxiety disorder: Secondary | ICD-10-CM

## 2013-04-10 DIAGNOSIS — R413 Other amnesia: Secondary | ICD-10-CM

## 2016-08-06 ENCOUNTER — Encounter (HOSPITAL_COMMUNITY): Payer: Self-pay | Admitting: Emergency Medicine

## 2016-08-06 ENCOUNTER — Emergency Department (HOSPITAL_COMMUNITY)
Admission: EM | Admit: 2016-08-06 | Discharge: 2016-08-06 | Disposition: A | Discharge status: Home / Self Care | Payer: Medicare Other | Attending: Emergency Medicine | Admitting: Emergency Medicine

## 2016-08-06 ENCOUNTER — Emergency Department (HOSPITAL_COMMUNITY): Payer: Medicare Other

## 2016-08-06 DIAGNOSIS — J189 Pneumonia, unspecified organism: Secondary | ICD-10-CM | POA: Diagnosis not present

## 2016-08-06 DIAGNOSIS — Z7982 Long term (current) use of aspirin: Secondary | ICD-10-CM | POA: Insufficient documentation

## 2016-08-06 DIAGNOSIS — Z79899 Other long term (current) drug therapy: Secondary | ICD-10-CM | POA: Diagnosis not present

## 2016-08-06 DIAGNOSIS — W010XXA Fall on same level from slipping, tripping and stumbling without subsequent striking against object, initial encounter: Secondary | ICD-10-CM | POA: Diagnosis not present

## 2016-08-06 DIAGNOSIS — Y9289 Other specified places as the place of occurrence of the external cause: Secondary | ICD-10-CM | POA: Insufficient documentation

## 2016-08-06 DIAGNOSIS — S20222A Contusion of left back wall of thorax, initial encounter: Secondary | ICD-10-CM | POA: Diagnosis not present

## 2016-08-06 DIAGNOSIS — Y939 Activity, unspecified: Secondary | ICD-10-CM | POA: Diagnosis not present

## 2016-08-06 DIAGNOSIS — S2242XA Multiple fractures of ribs, left side, initial encounter for closed fracture: Secondary | ICD-10-CM | POA: Insufficient documentation

## 2016-08-06 DIAGNOSIS — I10 Essential (primary) hypertension: Secondary | ICD-10-CM | POA: Insufficient documentation

## 2016-08-06 DIAGNOSIS — Y999 Unspecified external cause status: Secondary | ICD-10-CM | POA: Insufficient documentation

## 2016-08-06 DIAGNOSIS — S299XXA Unspecified injury of thorax, initial encounter: Secondary | ICD-10-CM | POA: Diagnosis present

## 2016-08-06 DIAGNOSIS — J181 Lobar pneumonia, unspecified organism: Secondary | ICD-10-CM

## 2016-08-06 MED ORDER — AZITHROMYCIN 250 MG PO TABS: 250.0000 mg | ORAL_TABLET | Freq: Every day | ORAL | 0 refills | Status: DC

## 2016-08-06 MED ORDER — HYDROCODONE-ACETAMINOPHEN 5-325 MG PO TABS: 1.0000 | ORAL_TABLET | ORAL | 0 refills | Status: DC | PRN

## 2016-08-06 NOTE — ED Notes (Signed)
Bed: NW29WA05 Expected date: 08/06/16 Expected time: 7:26 AM Means of arrival: Ambulance Comments: 80+ F-Bil leg injuries

## 2016-08-06 NOTE — Discharge Instructions (Signed)
There were rib fractures noted on your x-ray today.  These will take a few weeks to heal. Take the prescribed medication as directed.  Use caution, pain medication can make you sleepy/drowsy. Make sure to use the incentive spirometer. This will help keep breathing fluid and help prevent worsening infection. Follow-up with your primary care doctor. Return to the ED for new or worsening symptom-- feelings of shortness of breath, labored breathing, etc.

## 2016-08-06 NOTE — ED Provider Notes (Signed)
[facility]-EMERGENCY DEPT Provider Note   CSN: [id number] Arrival date & time: 08/06/16  4098     History   Chief Complaint Chief Complaint  Patient presents with  . Ribcage Pain    HPI [name] is a 60 y.o. male.  The history is provided by the patient and medical records.    60 year old male with history, hyperlipidemia, hypertension, early onset Alzheimer's, presenting to the ED for continued left rib/back pain. Majority of history is provided by patient's wife. She reports they were in a [employer] football game on 07/31/2016 when patient tripped and fell on the bleachers. She states he fell down about 2 rows of bleachers onto his left side. States he was checked out by the paramedics at the football game, and felt fine so they should not hospital. States he does go to an adult daycare program during the day, and staff there report he was complaining of some back/rib pain. She took him to an urgent care and had a chest x-ray performed which showed possible left-sided effusion and questionable infiltrate. She states overall his pain seems to have improved with Aleve. States sometimes he does appear uncomfortable at night when trying to sleep. He denies any chest pain or shortness of breath. There is been no episodes of labored breathing. Wife reports he has had an occasional cough, nothing productive for persistent. No hemoptysis. No fever or chills. No nausea or vomiting. No abdominal pain. Has overall been in his baseline. Wife reports they were told to follow-up today for recheck due to the questionable "fluid".    Past Medical History:  Diagnosis Date  . Anxiety   . Hypercholesterolemia   . Hypertension     There are no active problems to display for this patient.   Past Surgical History:  Procedure Laterality Date  . HERNIA REPAIR         Home Medications    Prior to Admission medications   Medication Sig Start Date End Date Taking? Authorizing Provider  aspirin EC 81  MG tablet Take 81 mg by mouth at bedtime.     Historical Provider, MD  clonazePAM (KLONOPIN) 2 MG tablet Take 2 mg by mouth 2 (two) times daily as needed. anxiety    Historical Provider, MD  famotidine (PEPCID) 20 MG tablet Take 1 tablet (20 mg total) by mouth 2 (two) times daily. 12/11/11 12/10/12  Bethany Hunt, PA-C  nebivolol (BYSTOLIC) 2.5 MG tablet Take 2.5 mg by mouth at bedtime.     Historical Provider, MD  PARoxetine (PAXIL) 10 MG tablet Take 10 mg by mouth every morning.    Historical Provider, MD  simvastatin (ZOCOR) 20 MG tablet Take 20 mg by mouth at bedtime.    Historical Provider, MD    Family History No family history on file.  Social History Social History  Substance Use Topics  . Smoking status: Never Smoker  . Smokeless tobacco: Not on file  . Alcohol use No     Allergies   Patient has no known allergies.   Review of Systems Review of Systems  Musculoskeletal: Positive for arthralgias and back pain.  All other systems reviewed and are negative.    Physical Exam Updated Vital Signs BP 139/95 (BP Location: Left Arm)   Pulse 69   Temp 97.6 F (36.4 C) (Oral)   Resp 16   SpO2 99%   Physical Exam  Constitutional: He appears well-developed and well-nourished.  HENT:  Head: Normocephalic and atraumatic.  Mouth/Throat: Oropharynx is  clear and moist.  No signs of head trauma  Eyes: Conjunctivae and EOM are normal. Pupils are equal, round, and reactive to light.  Neck: Normal range of motion.  Cardiovascular: Normal rate, regular rhythm and normal heart sounds.   Pulmonary/Chest: Effort normal and breath sounds normal. He has no decreased breath sounds. He has no wheezes. He has no rhonchi.  Some tenderness noted of the left lower lateral and posterior ribs, there is associated bruising, no deformity or crepitus, no flail segment, lungs clear bilaterally, no distress, speaking in sentences easily  Abdominal: Soft. Bowel sounds are normal. There is no  tenderness. There is no rigidity and no guarding.  No bruising of the abdomen  Musculoskeletal: Normal range of motion.       Back:  Area of contusion of the left mid back, just below the left scapula; there is soft tissue swelling and bruising noted; no midline thoracic spinal tenderness; full range of motion of all spinal levels, no apparent deformity or step-off  Neurological: He is alert.  Awake, alert, oriented to his baseline, moving all his extremities well, normal gait  Skin: Skin is warm and dry.  Psychiatric: He has a normal mood and affect.  Nursing note and vitals reviewed.    ED Treatments / Results  Labs (all labs ordered are listed, but only abnormal results are displayed) Labs Reviewed - No data to display  EKG  EKG Interpretation None       Radiology Dg Ribs Unilateral W/chest Left  Result Date: 08/06/2016 CLINICAL DATA:  Larey SeatFell down on Saturday left posterior rib pain EXAM: LEFT RIBS AND CHEST - 3+ VIEW COMPARISON:  01/13/2013 FINDINGS: Five views left ribs submitted. There is nondisplaced fracture of the left eighth rib. Mild displaced fracture of the left ninth and tenth rib. Small left pleural effusion with left basilar atelectasis or infiltrate. There is no pneumothorax. Minimal displaced fracture of the left eleventh rib. IMPRESSION: There is nondisplaced fracture of the left eighth rib. Mild displaced fracture of the left ninth and tenth rib. Small left pleural effusion with left basilar atelectasis or infiltrate. There is no pneumothorax. Minimal displaced fracture of the left eleventh rib. Electronically Signed   By: Natasha MeadLiviu  Pop M.D.   On: 08/06/2016 08:11    Procedures Procedures (including critical care time)  Medications Ordered in ED Medications - No data to display   Initial Impression / Assessment and Plan / ED Course  I have reviewed the triage vital signs and the nursing notes.  Pertinent labs & imaging results that were available during my  care of the patient were reviewed by me and considered in my medical decision making (see chart for details).  Clinical Course    60 y.o. M here with fall on bleachers 6 days ago.  Seen at urgent care, concern for possible effusion vs infiltrate.  Told to follow-up here for re-check.  Patient is afebrile, non-toxic.  His vitals are stable.  He is in no acute respiratory distress.  He does have area of contusion to left upper back with soft tissue swelling and bruising.  No midline thoracic step-off, deformity, or tenderness.  Neurologically intact to baseline.  Does have some left lower rib tenderness. No abdominal tenderness or bruising. Dedicated rib films obtained-- rib fractures noted on left, no significant displacement.  No pneumothorax.  Again noted questionable area of infiltrate. I have reviewed the films-- pleural effusion is minimal, rib fractures visualized, questionable infiltrate noted.  Doubt large hemothorax given.  Given his rib fractures, development of pneumonia is certainly possible and concerning. His injuries are 246 days old at this time and he is clinically stable.  He does not have any apparent abdominal pain or bruising of the abdomen. I have low suspicion for acute intra-abdominal trauma. Do not feel patient needs CT's, admission, or formal trauma consultation at this time as it likely will not change his management 6 days out from injury.  Will start on abx, send home with incentive spirometer.  Encouraged to follow-up with PCP. Discussed expected management that symptoms will likely improve over the next few weeks. He should return here for any new or worsening symptoms including shortness of breath, severe pain, labored breathing, hemoptysis, nausea, or vomiting.  Final Clinical Impressions(s) / ED Diagnoses   Final diagnoses:  Closed fracture of multiple ribs of left side, initial encounter  Community acquired pneumonia of left lower lobe of lung (HCC)    New  Prescriptions Discharge Medication List as of 08/06/2016  8:37 AM    START taking these medications   Details  azithromycin (ZITHROMAX) 250 MG tablet Take 1 tablet (250 mg total) by mouth daily. Take first 2 tablets together, then 1 every day until finished., Starting Fri 08/06/2016, Print    HYDROcodone-acetaminophen (NORCO/VICODIN) 5-325 MG tablet Take 1 tablet by mouth every 4 (four) hours as needed., Starting Fri 08/06/2016, Print         Garlon HatchetLisa M Lissette Schenk, PA-C 08/06/16 1240    Mancel BaleElliott Wentz, MD 08/06/16 [phone]

## 2016-08-06 NOTE — ED Triage Notes (Signed)
Pt complaint of continued left ribcage pain post fall at football [place] bleachers on 11/18. Was evaluated Wednesday and told to be reevaluated today for "fluid on the lung."

## 2016-08-06 NOTE — ED Triage Notes (Signed)
Pt instructed on use of Incentive Spirometry. Able to demonstrate in return. Wife also present and understands using IS.

## 2016-08-09 NOTE — ED Notes (Signed)
Pt's chart accessed to print AVS.  Pt's wife called and needed a form filled out for [facility] program.  (11/27 @ 1130)

## 2018-06-22 DIAGNOSIS — F411 Generalized anxiety disorder: Secondary | ICD-10-CM | POA: Insufficient documentation

## 2018-07-03 ENCOUNTER — Ambulatory Visit (INDEPENDENT_AMBULATORY_CARE_PROVIDER_SITE_OTHER): Payer: Medicare Other | Admitting: Psychiatry

## 2018-07-03 ENCOUNTER — Encounter: Payer: Self-pay | Admitting: Psychiatry

## 2018-07-03 DIAGNOSIS — F411 Generalized anxiety disorder: Secondary | ICD-10-CM | POA: Diagnosis not present

## 2018-07-03 DIAGNOSIS — G309 Alzheimer's disease, unspecified: Secondary | ICD-10-CM | POA: Diagnosis not present

## 2018-07-03 DIAGNOSIS — F0280 Dementia in other diseases classified elsewhere without behavioral disturbance: Secondary | ICD-10-CM | POA: Diagnosis not present

## 2018-07-03 DIAGNOSIS — F0390 Unspecified dementia without behavioral disturbance: Secondary | ICD-10-CM | POA: Insufficient documentation

## 2018-07-03 NOTE — Progress Notes (Signed)
[facility]  Patient ID: [name],  MRN: [id number]  PCP: [name], MD  Date of Evaluation: 07/03/2018 Time spent:35 minutes   HISTORY/CURRENT STATUS: HPI  CC: progressing Alzheimer's  Per wife. He denies depression and anxiety.  Has to be reminded to do ADL's but can do it.  No trouble eating.  No diarrhea.  Wears depends in case.  Individual Medical History/ Review of Systems: Changes? :No  Allergies: Patient has no known allergies.  Current Medications:  Current Outpatient Medications:  .  citalopram (CELEXA) 40 MG tablet, Take 40 mg by mouth daily., Disp: , Rfl:  .  donepezil (ARICEPT) 10 MG tablet, Take 10 mg by mouth at bedtime., Disp: , Rfl:  .  memantine (NAMENDA) 10 MG tablet, Take 10 mg by mouth 2 (two) times daily., Disp: , Rfl:  .  nebivolol (BYSTOLIC) 5 MG tablet, Take 5 mg by mouth daily. , Disp: , Rfl:  .  risperiDONE (RISPERDAL) 0.5 MG tablet, Take 0.5 mg by mouth every morning., Disp: , Rfl:  .  vitamin B-12 (CYANOCOBALAMIN) 1000 MCG tablet, Take 1,000 mcg by mouth daily., Disp: , Rfl:  .  aspirin EC 81 MG tablet, Take 81 mg by mouth at bedtime. , Disp: , Rfl:  .  azithromycin (ZITHROMAX) 250 MG tablet, Take 1 tablet (250 mg total) by mouth daily. Take first 2 tablets together, then 1 every day until finished. (Patient not taking: Reported on 07/03/2018), Disp: 6 tablet, Rfl: 0 .  famotidine (PEPCID) 20 MG tablet, Take 1 tablet (20 mg total) by mouth 2 (two) times daily., Disp: 30 tablet, Rfl: 0 .  HYDROcodone-acetaminophen (NORCO/VICODIN) 5-325 MG tablet, Take 1 tablet by mouth every 4 (four) hours as needed. (Patient not taking: Reported on 07/03/2018), Disp: 20 tablet, Rfl: 0 .  simvastatin (ZOCOR) 20 MG tablet, Take 20 mg by mouth at bedtime., Disp: , Rfl:  Medication Side Effects: None  Family Medical/ Social History: Changes? Yes Attends [employer] day care.  MENTAL HEALTH EXAM:  There were no vitals taken for this  visit.There is no height or weight on file to calculate BMI.  General Appearance: Casual  Eye Contact:  Fair  Speech:  repetitive.  Volume:  Normal  Mood:  Euthymic  Affect:  Appropriate  Thought Process:  Disorganized  Orientation:  Other:  Oriented only to the doctor and his wife.  He is not oriented to time.  Thought Content: poverty.   Suicidal Thoughts:  No  Homicidal Thoughts:  No  Memory:  Impaired globally except remembers MD name with prompting. Not time.  Judgement:  Poor  Insight:  Lacking  Psychomotor Activity:  Normal  Concentration:  Concentration: Poor  Recall:  Poor  Fund of Knowledge: Poor  Language: Fair; repetitive.  Akathisia:  No  AIMS (if indicated): not done  Assets:  Financial Resources/Insurance Housing Physical Health Social Support  ADL's:  Impaired  Cognition: Impaired,  Moderate  Prognosis:  Poor    DIAGNOSES:    ICD-10-CM   1. 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 Generalized anxiety disorder F41.1     RECOMMENDATIONS:  Greater than 50% of face to face time with patient was spent on counseling and coordination of care. We discussed progression of his Alzheimer's disease and ways to manage it with his wife.  His wife provided most of the history as he was unable to do so.  He is notably more repetitive. Filled out form for [facility].  He is greatly benefiting from this program. Continue meds as noted.  We have discussed the risks of using risperidone for anxiety and behavior control in a patient with Alzheimer's disease that could lead to early death.  She is in agreement that the current benefit outweighs the side effect risk.  He is repetitiveness in reassurance seeking was unmanageable off of the risperidone.  As his Alzheimer's disease progresses we may again attempt a reduction. Seen at [facility] care yearly. Last in  May  Follow-up 62-month  Lauraine Rinne, MD

## 2018-07-10 ENCOUNTER — Other Ambulatory Visit: Payer: Self-pay

## 2018-07-10 MED ORDER — NEBIVOLOL HCL 5 MG PO TABS: 5.0000 mg | ORAL_TABLET | Freq: Every day | ORAL | 4 refills | Status: DC

## 2018-08-14 ENCOUNTER — Other Ambulatory Visit: Payer: Self-pay

## 2018-08-14 MED ORDER — RISPERIDONE 0.5 MG PO TABS: 0.5000 mg | ORAL_TABLET | ORAL | 1 refills | Status: DC

## 2018-08-14 MED ORDER — CITALOPRAM HYDROBROMIDE 40 MG PO TABS: 40.0000 mg | ORAL_TABLET | Freq: Every day | ORAL | 1 refills | Status: DC

## 2018-08-24 DIAGNOSIS — R451 Restlessness and agitation: Secondary | ICD-10-CM | POA: Insufficient documentation

## 2018-11-27 ENCOUNTER — Other Ambulatory Visit: Payer: Self-pay

## 2018-11-27 MED ORDER — NEBIVOLOL HCL 5 MG PO TABS: 5.0000 mg | ORAL_TABLET | Freq: Every day | ORAL | 2 refills | Status: DC

## 2018-12-18 ENCOUNTER — Ambulatory Visit: Payer: Medicare Other | Admitting: Psychiatry

## 2019-02-12 ENCOUNTER — Ambulatory Visit (INDEPENDENT_AMBULATORY_CARE_PROVIDER_SITE_OTHER): Payer: Medicare Other | Admitting: Psychiatry

## 2019-02-12 ENCOUNTER — Encounter: Payer: Self-pay | Admitting: Psychiatry

## 2019-02-12 ENCOUNTER — Other Ambulatory Visit: Payer: Self-pay

## 2019-02-12 DIAGNOSIS — F4001 Agoraphobia with panic disorder: Secondary | ICD-10-CM | POA: Diagnosis not present

## 2019-02-12 DIAGNOSIS — F411 Generalized anxiety disorder: Secondary | ICD-10-CM | POA: Diagnosis not present

## 2019-02-12 DIAGNOSIS — F3342 Major depressive disorder, recurrent, in full remission: Secondary | ICD-10-CM | POA: Diagnosis not present

## 2019-02-12 DIAGNOSIS — G300 Alzheimer's disease with early onset: Secondary | ICD-10-CM

## 2019-02-12 DIAGNOSIS — F0280 Dementia in other diseases classified elsewhere without behavioral disturbance: Secondary | ICD-10-CM

## 2019-02-12 NOTE — Progress Notes (Signed)
[name] [id number] 12/14/1955 63 y.o.   Subjective:   Patient ID:  [name] is a 63 y.o. (DOB 01-03-1956) male.  Chief Complaint:  Chief Complaint  Patient presents with  . Follow-up    Medication Management  . Anxiety    Medication Management    HPI [name] presents for follow-up of anxiety and dementia.  Seen with wife to provide history.  She has help caring for him.  Has a grant to help with that.  His brothers helped some.  Center will open up about June 15.    Appt with [facility] neurologist June 17.  Their prior med changes were to increase the risperidone to 0.5 mg BID and DC citalopram and reduced the donepezil to 5 mg daily.  These changes were made in March.  Wife notices more rocking movements but not discomfort from it.  No pain complaints.  Denies worry.  Wife agrees he does not appear to have anxiety generally.Good appetite but still losing weight.  Sleep good.  Not depressed.  No sig agitation nor aggression.  Wife sees significant progression of forgetfulness.  Can't follow plots on tv shows. Incontinent usually.  Manages with depends.   Past Psychiatric Medication Trials: citalopram, clonazepam,, risperidone, sertraline 200 mg, Seroquel palpitations, clonidine, fluvoxamine, paroxetine, imipramine side effects  Review of Systems:  Review of Systems  Neurological: Negative for tremors and weakness.    Medications: I have reviewed the patient's current medications.  Current Outpatient Medications  Medication Sig Dispense Refill  . donepezil (ARICEPT) 10 MG tablet Take 5 mg by mouth at bedtime.     . memantine (NAMENDA) 10 MG tablet Take 10 mg by mouth 2 (two) times daily.    . nebivolol (BYSTOLIC) 5 MG tablet Take 1 tablet (5 mg total) by mouth daily. 30 tablet 2  . risperiDONE (RISPERDAL) 0.5 MG tablet Take 1 tablet (0.5 mg total) by mouth every morning. (Patient taking differently: Take 0.5 mg by mouth 2 (two) times daily. ) 90 tablet 1  .  vitamin B-12 (CYANOCOBALAMIN) 1000 MCG tablet Take 1,000 mcg by mouth daily.     No current facility-administered medications for this visit.     Medication Side Effects: None  Allergies: No Known Allergies  Past Medical History:  Diagnosis Date  . Anxiety   . Hypercholesterolemia   . Hypertension     History reviewed. No pertinent family history.  Social History   Socioeconomic History  . Marital status: Married    Spouse name: Not on file  . Number of children: Not on file  . Years of education: Not on file  . Highest education level: Not on file  Occupational History  . Not on file  Social Needs  . Financial resource strain: Not on file  . Food insecurity:    Worry: Not on file    Inability: Not on file  . Transportation needs:    Medical: Not on file    Non-medical: Not on file  Tobacco Use  . Smoking status: Never Smoker  . Smokeless tobacco: Never Used  Substance and Sexual Activity  . Alcohol use: No  . Drug use: No  . Sexual activity: Not on file  Lifestyle  . Physical activity:    Days per week: Not on file    Minutes per session: Not on file  . Stress: Not on file  Relationships  . Social connections:    Talks on phone: Not on file  Gets together: Not on file    Attends religious service: Not on file    Active member of club or organization: Not on file    Attends meetings of clubs or organizations: Not on file    Relationship status: Not on file  . Intimate partner violence:    Fear of current or ex partner: Not on file    Emotionally abused: Not on file    Physically abused: Not on file    Forced sexual activity: Not on file  Other Topics Concern  . Not on file  Social History Narrative  . Not on file    Past Medical History, Surgical history, Social history, and Family history were reviewed and updated as appropriate.   Please see review of systems for further details on the patient's review from today.   Objective:   Physical  Exam:  There were no vitals taken for this visit.  Physical Exam Psychiatric:        Attention and Perception: He is inattentive. He does not perceive auditory or visual hallucinations.        Mood and Affect: Mood is not anxious or depressed. Affect is not angry.        Speech: Speech is delayed. Speech is not slurred.        Behavior: Behavior is not agitated, aggressive or hyperactive.        Thought Content: Thought content is not paranoid. Thought content does not include homicidal or suicidal ideation.        Cognition and Memory: Cognition is impaired. Memory is impaired. He exhibits impaired recent memory and impaired remote memory.     Comments: Did not know name of doctor.  He is disoriented to time.  He knows his wife's name he knows his name.  He is not oriented to situation.  He could answer yes/no questions.  He obviously has marked cognitive impairment globally.  Therefore his insight and judgment are impaired and he is completely unable to make any decisions for his own wellbeing.     Lab Review:     Component Value Date/Time   NA 135 01/12/2012 1750   K 3.3 (L) 01/12/2012 1750   CL 98 01/12/2012 1750   CO2 25 01/12/2012 1750   GLUCOSE 116 (H) 01/12/2012 1750   BUN <3 (L) 01/12/2012 1750   CREATININE 0.62 01/12/2012 1750   CALCIUM 8.1 (L) 01/12/2012 1750   PROT 6.9 01/12/2012 1750   ALBUMIN 3.7 01/12/2012 1750   AST 19 01/12/2012 1750   ALT 16 01/12/2012 1750   ALKPHOS 71 01/12/2012 1750   BILITOT 0.2 (L) 01/12/2012 1750   GFRNONAA >90 01/12/2012 1750   GFRAA >90 01/12/2012 1750       Component Value Date/Time   WBC 3.9 (L) 01/12/2012 1750   RBC 4.56 01/12/2012 1750   HGB 13.5 01/12/2012 1750   HCT 39.3 01/12/2012 1750   PLT 222 01/12/2012 1750   MCV 86.2 01/12/2012 1750   MCH 29.6 01/12/2012 1750   MCHC 34.4 01/12/2012 1750   RDW 13.1 01/12/2012 1750   LYMPHSABS 0.7 01/12/2012 1750   MONOABS 0.6 01/12/2012 1750   EOSABS 0.1 01/12/2012 1750   BASOSABS  0.0 01/12/2012 1750    No results found for: POCLITH, LITHIUM   Lab Results  Component Value Date   VALPROATE 16.4 (L) 04/04/2011     .res Assessment: Plan:    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  However his Alzheimer's disease has progressed to moderately severe category that he really no longer has the capacity to worry and he no longer has panic attacks.  He has been weaned off the SSRIs without any complication.  The risperidone has been helpful for periods of agitation.  They are well controlled.  The rocking movements are not continuous.  It does not appear that they are related to akathisia or any tardive dyskinesia.  He appears to be tolerating his current medications well.  Discussed potential metabolic side effects associated with atypical antipsychotics, as well as potential risk for movement side effects. Advised pt to contact office if movement side effects occur.  Wife is also aware that antipsychotics have been associated with increased risk of death in dementia patients.  In this case the antipsychotic has been helpful and no changes indicated.  No indication to change medication.  Follow-up 6 months or sooner as needed.  Elveria Royals, MD, DFAPA  Please see After Visit Summary for patient specific instructions.  Future Appointments  Date Time Provider Department Center  08/15/2019  4:30 PM Cottle, Steva Ready., MD [facility] None    No orders of the defined types were placed in this encounter.     -------------------------------

## 2019-02-19 ENCOUNTER — Other Ambulatory Visit: Payer: Self-pay | Admitting: Psychiatry

## 2019-05-14 ENCOUNTER — Other Ambulatory Visit: Payer: Self-pay | Admitting: Psychiatry

## 2019-06-04 ENCOUNTER — Other Ambulatory Visit: Payer: Self-pay | Admitting: *Deleted

## 2019-06-04 ENCOUNTER — Other Ambulatory Visit: Payer: Self-pay

## 2019-06-04 DIAGNOSIS — Z125 Encounter for screening for malignant neoplasm of prostate: Secondary | ICD-10-CM

## 2019-06-04 NOTE — Progress Notes (Signed)
Patient: PORTER MOES           Date of Birth: 04-Nov-1955           MRN: [id number] Visit Date: 06/04/2019 PCP: Jonathon Jordan, MD  Prostate Cancer Screening Date of last physical exam: (less than a year ago) Date of last rectal exam: (last year) Have you ever had any of the following?: (Father prostate cancer, Brother prostate cancer) Have you ever had or been told you have an allergy to latex products?: No Are you currently taking any natural prostate preparations?: No Are you currently experiencing any urinary symptoms?: Yes  Prostate Exam Exam not completed.  Patient's History Patient Active Problem List   Diagnosis Date Noted  . 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 GAD (generalized anxiety disorder) 06/22/2018   Past Medical History:  Diagnosis Date  . Anxiety   . Hypercholesterolemia   . Hypertension     No family history on file.  Social History   Occupational History  . Not on file  Tobacco Use  . Smoking status: Never Smoker  . Smokeless tobacco: Never Used  Substance and Sexual Activity  . Alcohol use: No  . Drug use: No  . Sexual activity: Not on file

## 2019-07-23 ENCOUNTER — Encounter (HOSPITAL_BASED_OUTPATIENT_CLINIC_OR_DEPARTMENT_OTHER): Payer: Medicare Other | Attending: Internal Medicine | Admitting: Internal Medicine

## 2019-07-23 ENCOUNTER — Other Ambulatory Visit: Payer: Self-pay

## 2019-07-23 DIAGNOSIS — Z9183 Wandering in diseases classified elsewhere: Secondary | ICD-10-CM | POA: Insufficient documentation

## 2019-07-23 DIAGNOSIS — L89223 Pressure ulcer of left hip, stage 3: Secondary | ICD-10-CM | POA: Diagnosis present

## 2019-07-23 DIAGNOSIS — Z87891 Personal history of nicotine dependence: Secondary | ICD-10-CM | POA: Diagnosis not present

## 2019-07-23 DIAGNOSIS — F0281 Dementia in other diseases classified elsewhere with behavioral disturbance: Secondary | ICD-10-CM | POA: Insufficient documentation

## 2019-07-23 DIAGNOSIS — I10 Essential (primary) hypertension: Secondary | ICD-10-CM | POA: Insufficient documentation

## 2019-07-23 DIAGNOSIS — G300 Alzheimer's disease with early onset: Secondary | ICD-10-CM | POA: Diagnosis not present

## 2019-08-06 ENCOUNTER — Encounter (HOSPITAL_BASED_OUTPATIENT_CLINIC_OR_DEPARTMENT_OTHER): Payer: Medicare Other | Admitting: Physician Assistant

## 2019-08-06 ENCOUNTER — Other Ambulatory Visit: Payer: Self-pay

## 2019-08-06 DIAGNOSIS — L89223 Pressure ulcer of left hip, stage 3: Secondary | ICD-10-CM | POA: Diagnosis not present

## 2019-08-15 ENCOUNTER — Encounter: Payer: Self-pay | Admitting: Psychiatry

## 2019-08-15 ENCOUNTER — Ambulatory Visit (INDEPENDENT_AMBULATORY_CARE_PROVIDER_SITE_OTHER): Payer: Medicare Other | Admitting: Psychiatry

## 2019-08-15 ENCOUNTER — Other Ambulatory Visit: Payer: Self-pay

## 2019-08-15 DIAGNOSIS — F411 Generalized anxiety disorder: Secondary | ICD-10-CM

## 2019-08-15 DIAGNOSIS — G300 Alzheimer's disease with early onset: Secondary | ICD-10-CM

## 2019-08-15 DIAGNOSIS — Z658 Other specified problems related to psychosocial circumstances: Secondary | ICD-10-CM

## 2019-08-15 DIAGNOSIS — F0280 Dementia in other diseases classified elsewhere without behavioral disturbance: Secondary | ICD-10-CM | POA: Diagnosis not present

## 2019-08-15 DIAGNOSIS — Z8659 Personal history of other mental and behavioral disorders: Secondary | ICD-10-CM

## 2019-08-15 NOTE — Progress Notes (Signed)
[name] [id number] 1955/09/27 63 y.o.   Subjective:   Patient ID:  [name] is a 63 y.o. (DOB 1955/09/27) male.  Chief Complaint:  Chief Complaint  Patient presents with  . Follow-up    Medication Management  . Anxiety    HPI [name] presents for follow-up of anxiety and dementia.  Last seen June 2020.  No meds were changed.  He was continuing to have gradual progression of his early onset Alzheimer's disease.  Seen with wife to provide history.  She has help caring for him.  Has a grant to help with that.  His brothers helped some.    [facility] and September 16..  Their prior med changes were to increase the risperidone to 0.5 mg BID and DC citalopram and DC donepezil to 5 mg daily.  Mirtazapine was added for restlessness. [facility]    She has a hard time getting him in the car at times bc he doesn't understand what to do.  Has become less verbal since here.  Little spontaneous speech.  Has gained a little weidht with mirtazapine.  No trouble swallowing.  Wife notices more rocking movements but not discomfort from it. Paces less with mirtazapine and losing weight.   No pain complaints.  Denies worry.  Wife agrees he does not appear to have anxiety generally.Good appetite but still losing weight.  Sleep good.  Not depressed.  No sig agitation nor aggression.  Wife sees significant progression of forgetfulness.  Can't follow plots on tv shows. Incontinent usually.  Manages with depends.  Still walks with wife.   Past Psychiatric Medication Trials: citalopram, clonazepam,, risperidone, sertraline 200 mg, Seroquel palpitations, clonidine, fluvoxamine, paroxetine, imipramine side effects, mirtazapine  Review of Systems:  Review of Systems  Skin: Positive for wound.  Neurological: Negative for dizziness, tremors and weakness.    Medications: I have reviewed the patient's current medications.  Current Outpatient  Medications  Medication Sig Dispense Refill  . BYSTOLIC 5 MG tablet TAKE 1 TABLET BY MOUTH EVERY DAY 90 tablet 0  . memantine (NAMENDA) 10 MG tablet Take 10 mg by mouth 2 (two) times daily.    . mirtazapine (REMERON) 7.5 MG tablet Take 7.5 mg by mouth at bedtime.    . risperiDONE (RISPERDAL) 0.5 MG tablet Take 1 tablet (0.5 mg total) by mouth every morning. (Patient taking differently: Take 0.5 mg by mouth 2 (two) times daily. ) 90 tablet 1  . vitamin B-12 (CYANOCOBALAMIN) 1000 MCG tablet Take 1,000 mcg by mouth daily.     No current facility-administered medications for this visit.     Medication Side Effects: None  Allergies: No Known Allergies  Past Medical History:  Diagnosis Date  . Anxiety   . Hypercholesterolemia   . Hypertension     History reviewed. No pertinent family history.  Social History   Socioeconomic History  . Marital status: Married    Spouse name: Not on file  . Number of children: Not on file  . Years of education: Not on file  . Highest education level: Not on file  Occupational History  . Not on file  Social Needs  . Financial resource strain: Not on file  . Food insecurity    Worry: Not on file    Inability: Not on file  . Transportation needs    Medical: Not on file    Non-medical: Not on file  Tobacco Use  .  Smoking status: Never Smoker  . Smokeless tobacco: Never Used  Substance and Sexual Activity  . Alcohol use: No  . Drug use: No  . Sexual activity: Not on file  Lifestyle  . Physical activity    Days per week: Not on file    Minutes per session: Not on file  . Stress: Not on file  Relationships  . Social Herbalist on phone: Not on file    Gets together: Not on file    Attends religious service: Not on file    Active member of club or organization: Not on file    Attends meetings of clubs or organizations: Not on file    Relationship status: Not on file  . Intimate partner violence    Fear of current or ex  partner: Not on file    Emotionally abused: Not on file    Physically abused: Not on file    Forced sexual activity: Not on file  Other Topics Concern  . Not on file  Social History Narrative  . Not on file    Past Medical History, Surgical history, Social history, and Family history were reviewed and updated as appropriate.   Please see review of systems for further details on the patient's review from today.   Objective:   Physical Exam:  There were no vitals taken for this visit.  Physical Exam Constitutional:      General: He is not in acute distress.    Appearance: He is well-developed. He is ill-appearing.  Musculoskeletal:        General: No deformity.  Neurological:     Mental Status: He is alert. He is disoriented and confused.     Coordination: Coordination normal.     Comments: Fidgety. Some rocking settled with time in office No Parkinsonism.  Psychiatric:        Attention and Perception: Perception normal. He is inattentive. He does not perceive auditory or visual hallucinations.        Mood and Affect: Mood normal. Mood is not anxious or depressed. Affect is not labile, blunt, angry, tearful or inappropriate.        Speech: Speech is delayed. Speech is not slurred.        Behavior: Behavior is not agitated, aggressive or hyperactive.        Thought Content: Thought content is not paranoid. Thought content does not include homicidal or suicidal ideation. Thought content does not include homicidal or suicidal plan.        Cognition and Memory: Cognition is impaired. Memory is impaired. He exhibits impaired recent memory and impaired remote memory.     Comments: Did not know name of doctor.  He is disoriented to time.  He knows his wife's name he knows his name.  He is not oriented to situation.  He could no longer answer yes/no questions.  He obviously has marked cognitive impairment globally.  Therefore his insight and judgment are impaired and he is completely  unable to make any decisions for his own wellbeing.     Lab Review:     Component Value Date/Time   NA 135 01/12/2012 1750   K 3.3 (L) 01/12/2012 1750   CL 98 01/12/2012 1750   CO2 25 01/12/2012 1750   GLUCOSE 116 (H) 01/12/2012 1750   BUN <3 (L) 01/12/2012 1750   CREATININE 0.62 01/12/2012 1750   CALCIUM 8.1 (L) 01/12/2012 1750   PROT 6.9 01/12/2012 1750   ALBUMIN 3.7  01/12/2012 1750   AST 19 01/12/2012 1750   ALT 16 01/12/2012 1750   ALKPHOS 71 01/12/2012 1750   BILITOT 0.2 (L) 01/12/2012 1750   GFRNONAA >90 01/12/2012 1750   GFRAA >90 01/12/2012 1750       Component Value Date/Time   WBC 3.9 (L) 01/12/2012 1750   RBC 4.56 01/12/2012 1750   HGB 13.5 01/12/2012 1750   HCT 39.3 01/12/2012 1750   PLT 222 01/12/2012 1750   MCV 86.2 01/12/2012 1750   MCH 29.6 01/12/2012 1750   MCHC 34.4 01/12/2012 1750   RDW 13.1 01/12/2012 1750   LYMPHSABS 0.7 01/12/2012 1750   MONOABS 0.6 01/12/2012 1750   EOSABS 0.1 01/12/2012 1750   BASOSABS 0.0 01/12/2012 1750    No results found for: POCLITH, LITHIUM   Lab Results  Component Value Date   VALPROATE 16.4 (L) 04/04/2011     .res Assessment: Plan:    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  However his Alzheimer's disease has progressed to moderately severe category that he really no longer has the capacity to worry and he no longer has panic attacks.  He has been weaned off the SSRIs without any complication.  The risperidone has been helpful for periods of agitation.  They are well controlled.  The rocking movements are not continuous.  It does not appear that they are related to akathisia or any tardive dyskinesia. But I am concerned he may be more sensitive to problems with this as his Alzheimer's progresses.  If so risipseridone should be reduced.   Disc with wife this could be done if needed.   He appears to be tolerating his current medications well.  Discussed potential metabolic side effects associated with atypical antipsychotics, as well as potential risk for movement side effects. Advised pt to contact office if movement side effects occur.  Wife is also aware that antipsychotics have been associated with increased risk of death in dementia patients.  In this case the antipsychotic has been helpful and no changes indicated.  No indication to change medication. Except try reducing risperidone to 0.5 mg daily as long as agitation is managed.  It might help with the excessive pacing.  Follow-up 6 months or sooner as needed.  Meredith Staggers, MD, DFAPA   Please see After Visit Summary for patient specific instructions.  Future Appointments  Date Time Provider Department Center  08/20/2019  3:30 PM Maxwell Caul, MD [facility]  02/13/2020  4:45 PM Cottle, Steva Ready., MD [facility] None    No orders of the defined types were placed in this encounter.     -------------------------------

## 2019-08-20 ENCOUNTER — Other Ambulatory Visit: Payer: Self-pay

## 2019-08-20 ENCOUNTER — Encounter (HOSPITAL_BASED_OUTPATIENT_CLINIC_OR_DEPARTMENT_OTHER): Payer: Medicare Other | Attending: Internal Medicine | Admitting: Internal Medicine

## 2019-08-20 DIAGNOSIS — F0280 Dementia in other diseases classified elsewhere without behavioral disturbance: Secondary | ICD-10-CM | POA: Insufficient documentation

## 2019-08-20 DIAGNOSIS — Z872 Personal history of diseases of the skin and subcutaneous tissue: Secondary | ICD-10-CM | POA: Insufficient documentation

## 2019-08-20 DIAGNOSIS — Z09 Encounter for follow-up examination after completed treatment for conditions other than malignant neoplasm: Secondary | ICD-10-CM | POA: Insufficient documentation

## 2019-08-20 DIAGNOSIS — G300 Alzheimer's disease with early onset: Secondary | ICD-10-CM | POA: Diagnosis not present

## 2019-08-21 NOTE — Progress Notes (Signed)
[name], [name] ([id number]) Visit Report for 07/23/2019 Chief Complaint Document Details Patient Name: Date of Service: [name], [name] 07/23/2019 2:45 PM Medical Record [id number] Patient Account Number: [id number] Date of Birth/Sex: Treating RN: 22-Oct-1955 (63 y.o. [name] Primary Care Provider: Jonathon Reeves Other Clinician: Referring Provider: Treating Provider/Extender:Reeves, Marvin Mor, [name] in Treatment: 0 Information Obtained from: Patient Chief Complaint 07/23/2019; patient is here accompanied by his wife for review of a pressure ulcer on the left greater trochanter Electronic Signature(s) Signed: 07/23/2019 5:25:49 PM By: Marvin Ham MD Entered By: [name] on 07/23/2019 16:06:30 -------------------------------------------------------------------------------- Debridement Details Patient Name: Date of Service: [name] 07/23/2019 2:45 PM Medical Record [id number] Patient Account Number: [id number] Date of Birth/Sex: 1956/03/06 (62 y.o. M) Treating RN: [name] Primary Care Provider: Jonathon Reeves Other Clinician: Referring Provider: Treating Provider/Extender:Reeves, Marvin Mor, [name] in Treatment: 0 Debridement Performed for Wound #1 Left Trochanter Assessment: Performed By: Physician [name]., MD Debridement Type: Debridement Level of Consciousness (Pre- Awake and Alert procedure): Pre-procedure Verification/Time Out Taken: Yes - 15:48 Start Time: 15:48 Pain Control: Other : Benzocaine 20% Total Area Debrided (L x W): 1.7 (cm) x 1.1 (cm) = 1.87 (cm) Tissue and other material Viable, Non-Viable, Slough, Subcutaneous, Slough debrided: Level: Skin/Subcutaneous Tissue Debridement Description: Excisional Instrument: Curette Bleeding: Moderate Hemostasis Achieved: Pressure End Time: 15:50 Procedural Pain: 0 Post Procedural Pain: 0 Response to Treatment: Procedure was  tolerated well Level of Consciousness Awake and Alert (Post-procedure): Post Debridement Measurements of Total Wound Length: (cm) 1.7 Stage: Category/Stage II Width: (cm) 1.1 Depth: (cm) 0.1 Volume: (cm) 0.147 Character of Wound/Ulcer Post Improved Debridement: Post Procedure Diagnosis Same as Pre-procedure Electronic Signature(s) Signed: 07/23/2019 5:25:49 PM By: Marvin Ham MD Signed: 07/23/2019 5:46:53 PM By: Marvin Hurst RN, BSN Entered By: [name] on 07/23/2019 16:06:12 -------------------------------------------------------------------------------- HPI Details Patient Name: Date of Service: [name] 07/23/2019 2:45 PM Medical Record [id number] Patient Account Number: [id number] Date of Birth/Sex: Treating RN: 01-20-1956 (63 y.o. [name] Primary Care Provider: Jonathon Reeves Other Clinician: Referring Provider: Treating Provider/Extender:Reeves, Marvin Mor, [name] in Treatment: 0 History of Present Illness HPI Description: Admission 07/23/2019 This is an unfortunate 63 year old man who has early onset Alzheimer's for 6 years. He is now at the severe stages of this. He is essentially nonverbal somewhat unsteady on his feet and doubly and incontinent. He is cared for at home by his wife. He goes for adult daycare 5 days a week. He has had a wound on the left greater trochanter since early October. They have been using Vaseline gauze. He has had 2 rounds of antibiotics including Keflex and Bactrim. The wound is complicated by the fact he insists on sleeping on his left side at night for reasons that are not clear and he will often sleep on this all night. He paces during the day. Past medical history Alzheimer's disease with coexistent anxiety, wandering Electronic Signature(s) Signed: 07/23/2019 5:25:49 PM By: Marvin Ham MD Entered By: [name] on 07/23/2019  16:08:23 -------------------------------------------------------------------------------- Physical Exam Details Patient Name: Date of Service: Marvin, Reeves 07/23/2019 2:45 PM Medical Record [id number] Patient Account Number: [id number] Date of Birth/Sex: Treating RN: [birth year] (63 y.o. [name] Primary Care Provider: Jonathon Reeves Other Clinician: Referring Provider: Treating Provider/Extender:Reeves, Marvin Mor, [name] in Treatment: 0 Constitutional Sitting or standing Blood Pressure is within target range for patient.. Pulse regular and within target range for patient.Marland Kitchen Respirations regular, non-labored and within target range.. Temperature is  normal and within the target range for the patient.. Patient is in no distress. Somewhat thin but not medically unwell. Respiratory work of breathing is normal. Bilateral breath sounds are clear and equal in all lobes with no wheezes, rales or rhonchi.. Cardiovascular Heart rhythm and rate regular, without murmur or gallop.. Gastrointestinal (GI) Abdomen is soft and non-distended without masses or tenderness.. No liver or spleen enlargement. Genitourinary (GU) Bladder is not distended. Musculoskeletal Gait is somewhat unsteady. Integumentary (Hair, Skin) No primary skin issue is seen. Psychiatric Severe dementia essentially nonverbal. Notes Wound exam; quarter sized area over the left greater trochanter. Very fibrinous necrotic material over the surface of. Is a yellow film under illumination. Difficult debridement using a #5 curette. Hemostasis with direct pressure Electronic Signature(s) Signed: 07/23/2019 5:25:49 PM By: Marvin Najjar MD Entered By: [name] on 07/23/2019 16:11:05 -------------------------------------------------------------------------------- Physician Orders Details Patient Name: Date of Service: [name], [name] 07/23/2019 2:45 PM Medical Record [id number] Patient  Account Number: [id number] Date of Birth/Sex: Treating RN: 1956-03-14 (62 y.o. [name] Primary Care Provider: Other Clinician: Mila Reeves Referring Provider: Treating Provider/Extender:Reeves, [name], [name] in Treatment: 0 Verbal / Phone Orders: No Diagnosis Coding Follow-up Appointments Return Appointment in 2 weeks. Dressing Change Frequency Wound #1 Left Trochanter Change Dressing every other day. Skin Barriers/Peri-Wound Care Wound #1 Left Trochanter Skin Prep Wound Cleansing Wound #1 Left Trochanter Clean wound with Normal Saline. - or wound cleanser Primary Wound Dressing Wound #1 Left Trochanter Iodoflex Secondary Dressing Wound #1 Left Trochanter Foam Border - or large bandaid Off-Loading Gel mattress overlay (Group 1) Turn and reposition every 2 hours Electronic Signature(s) Signed: 07/23/2019 5:25:49 PM By: Marvin Najjar MD Signed: 07/23/2019 5:46:53 PM By: Zandra Abts RN, BSN Entered By: Zandra Abts on 07/23/2019 16:05:16 -------------------------------------------------------------------------------- Problem List Details Patient Name: Date of Service: [name], [name] 07/23/2019 2:45 PM Medical Record [id number] Patient Account Number: [id number] Date of Birth/Sex: Treating RN: 1956-02-18 (62 y.o. [name] Primary Care Provider: Mila Reeves Other Clinician: Referring Provider: Treating Provider/Extender:Reeves, [name], [name] in Treatment: 0 Active Problems ICD-10 Evaluated Encounter Code Description Active Date Today Diagnosis L89.223 Pressure ulcer of left hip, stage 3 07/23/2019 No Yes G30.0 Alzheimer's disease with early onset 07/23/2019 No Yes Inactive Problems Resolved Problems Electronic Signature(s) Signed: 07/23/2019 5:25:49 PM By: Marvin Najjar MD Entered By: [name] on 07/23/2019  16:05:57 -------------------------------------------------------------------------------- Progress Note Details Patient Name: Date of Service: [name] 07/23/2019 2:45 PM Medical Record [id number] Patient Account Number: [id number] Date of Birth/Sex: Treating RN: Mar 13, 1956 (62 y.o. [name] Primary Care Provider: Mila Reeves Other Clinician: Referring Provider: Treating Provider/Extender:Reeves, [name], [name] in Treatment: 0 Subjective Chief Complaint Information obtained from Patient 07/23/2019; patient is here accompanied by his wife for review of a pressure ulcer on the left greater trochanter History of Present Illness (HPI) Admission 07/23/2019 This is an unfortunate 63 year old man who has early onset Alzheimer's for 6 years. He is now at the severe stages of this. He is essentially nonverbal somewhat unsteady on his feet and doubly and incontinent. He is cared for at home by his wife. He goes for adult daycare 5 days a week. He has had a wound on the left greater trochanter since early October. They have been using Vaseline gauze. He has had 2 rounds of antibiotics including Keflex and Bactrim. The wound is complicated by the fact he insists on sleeping on his left side at night for reasons that are not clear  and he will often sleep on this all night. He paces during the day. Past medical history Alzheimer's disease with coexistent anxiety, wandering Patient History Allergies No Known Allergies Family History Cancer - Father, Stroke - Mother, No family history of Diabetes, Heart Disease, Hereditary Spherocytosis, Hypertension, Kidney Disease, Lung Disease, Seizures, Thyroid Problems, Tuberculosis. Social History Former smoker, Marital Status - Married, Alcohol Use - Never, Drug Use - No History, Caffeine Use - Never. Medical History Eyes Denies history of Cataracts, Glaucoma, Optic Neuritis Ear/Nose/Mouth/Throat Denies history  of Chronic sinus problems/congestion, Middle ear problems Hematologic/Lymphatic Denies history of Anemia, Hemophilia, Human Immunodeficiency Virus, Lymphedema, Sickle Cell Disease Respiratory Denies history of Aspiration, Asthma, Chronic Obstructive Pulmonary Disease (COPD), Pneumothorax, Sleep Apnea, Tuberculosis Cardiovascular Patient has history of Hypertension Denies history of Angina, Arrhythmia, Congestive Heart Failure, Coronary Artery Disease, Deep Vein Thrombosis, Hypotension, Myocardial Infarction, Peripheral Arterial Disease, Peripheral Venous Disease, Phlebitis, Vasculitis Gastrointestinal Denies history of Cirrhosis , Colitis, Crohnoos, Hepatitis A, Hepatitis B, Hepatitis C Endocrine Denies history of Type I Diabetes, Type II Diabetes Genitourinary Denies history of End Stage Renal Disease Immunological Denies history of Lupus Erythematosus, Raynaudoos, Scleroderma Integumentary (Skin) Denies history of History of Burn Musculoskeletal Denies history of Gout, Rheumatoid Arthritis, Osteoarthritis, Osteomyelitis Neurologic Patient has history of Dementia Denies history of Neuropathy, Quadriplegia, Paraplegia, Seizure Disorder Oncologic Denies history of Received Chemotherapy, Received Radiation Psychiatric Denies history of Anorexia/bulimia, Confinement Anxiety Review of Systems (ROS) Constitutional Symptoms (General Health) Denies complaints or symptoms of Fatigue, Fever, Chills, Marked Weight Change. Eyes Complains or has symptoms of Glasses / Contacts. Denies complaints or symptoms of Dry Eyes, Vision Changes. Ear/Nose/Mouth/Throat Denies complaints or symptoms of Chronic sinus problems or rhinitis. Respiratory Denies complaints or symptoms of Chronic or frequent coughs, Shortness of Breath. Cardiovascular Denies complaints or symptoms of Chest pain. Gastrointestinal Denies complaints or symptoms of Frequent diarrhea, Nausea, Vomiting. Endocrine Denies  complaints or symptoms of Heat/cold intolerance. Genitourinary Denies complaints or symptoms of Frequent urination. Integumentary (Skin) Complains or has symptoms of Wounds. Musculoskeletal Denies complaints or symptoms of Muscle Pain, Muscle Weakness. Neurologic Denies complaints or symptoms of Numbness/parasthesias. Psychiatric Denies complaints or symptoms of Claustrophobia, Suicidal. Objective Constitutional Sitting or standing Blood Pressure is within target range for patient.. Pulse regular and within target range for patient.Marland Kitchen. Respirations regular, non-labored and within target range.. Temperature is normal and within the target range for the patient.. Patient is in no distress. Somewhat thin but not medically unwell. Vitals Time Taken: 3:16 PM, Height: 66 in, Source: Stated, Weight: 122 lbs, Source: Stated, BMI: 19.7, Temperature: 98.5 F, Pulse: 54 bpm, Respiratory Rate: 18 breaths/min, Blood Pressure: 105/71 mmHg. Respiratory work of breathing is normal. Bilateral breath sounds are clear and equal in all lobes with no wheezes, rales or rhonchi.. Cardiovascular Heart rhythm and rate regular, without murmur or gallop.. Gastrointestinal (GI) Abdomen is soft and non-distended without masses or tenderness.. No liver or spleen enlargement. Genitourinary (GU) Bladder is not distended. Musculoskeletal Gait is somewhat unsteady. Psychiatric Severe dementia essentially nonverbal. General Notes: Wound exam; quarter sized area over the left greater trochanter. Very fibrinous necrotic material over the surface of. Is a yellow film under illumination. Difficult debridement using a #5 curette. Hemostasis with direct pressure Integumentary (Hair, Skin) No primary skin issue is seen. Wound #1 status is Open. Original cause of wound was Gradually Appeared. The wound is located on the Left Trochanter. The wound measures 1.7cm length x 1.1cm width x 0.1cm depth; 1.469cm^2 area and  0.147cm^3 volume. There is  Fat Layer (Subcutaneous Tissue) Exposed exposed. There is no tunneling or undermining noted. There is a medium amount of serosanguineous drainage noted. The wound margin is flat and intact. There is medium (34-66%) pink, pale granulation within the wound bed. There is a medium (34-66%) amount of necrotic tissue within the wound bed including Adherent Slough. Assessment Active Problems ICD-10 Pressure ulcer of left hip, stage 3 Alzheimer's disease with early onset Procedures Wound #1 Pre-procedure diagnosis of Wound #1 is a Pressure Ulcer located on the Left Trochanter . There was a Excisional Skin/Subcutaneous Tissue Debridement with a total area of 1.87 sq cm performed by Maxwell Caul., MD. With the following instrument(s): Curette to remove Viable and Non-Viable tissue/material. Material removed includes Subcutaneous Tissue and Slough and after achieving pain control using Other (Benzocaine 20%). No specimens were taken. A time out was conducted at 15:48, prior to the start of the procedure. A Moderate amount of bleeding was controlled with Pressure. The procedure was tolerated well with a pain level of 0 throughout and a pain level of 0 following the procedure. Post Debridement Measurements: 1.7cm length x 1.1cm width x 0.1cm depth; 0.147cm^3 volume. Post debridement Stage noted as Category/Stage II. Character of Wound/Ulcer Post Debridement is improved. Post procedure Diagnosis Wound #1: Same as Pre-Procedure Plan Follow-up Appointments: Return Appointment in 2 weeks. Dressing Change Frequency: Wound #1 Left Trochanter: Change Dressing every other day. Skin Barriers/Peri-Wound Care: Wound #1 Left Trochanter: Skin Prep Wound Cleansing: Wound #1 Left Trochanter: Clean wound with Normal Saline. - or wound cleanser Primary Wound Dressing: Wound #1 Left Trochanter: Iodoflex Secondary Dressing: Wound #1 Left Trochanter: Foam Border - or large  bandaid Off-Loading: Gel mattress overlay (Group 1) Turn and reposition every 2 hours 1. Unfortunate man with severe Alzheimer's disease. Pressure ulcer on his hip. Debrided with a curette. He is going to need ongoing debridement will reach to Iodoflex under border foam. This can be changed every day if necessary or can be left on every second day I talked to his wife about this 2. Practical suggestions for removing the pressure off this at night 3. We will see what he might be eligible for in terms of a pressure relief mattress which will probably be a level 2 surface. 4. I see no evidence of infection around this wound at present, I do not think he needs any additional antibiotics Electronic Signature(s) Signed: 07/23/2019 5:25:49 PM By: Marvin Najjar MD Entered By: [name] on 07/23/2019 16:12:44 -------------------------------------------------------------------------------- HxROS Details Patient Name: Date of Service: [name] 07/23/2019 2:45 PM Medical Record [id number] Patient Account Number: [id number] Date of Birth/Sex: Treating RN: [birth year] (62 y.o. Melonie [place] Primary Care Provider: Mila Reeves Other Clinician: Referring Provider: Treating Provider/Extender:Reeves, [name], [name] in Treatment: 0 Constitutional Symptoms (General Health) Complaints and Symptoms: Negative for: Fatigue; Fever; Chills; Marked Weight Change Eyes Complaints and Symptoms: Positive for: Glasses / Contacts Negative for: Dry Eyes; Vision Changes Medical History: Negative for: Cataracts; Glaucoma; Optic Neuritis Ear/Nose/Mouth/Throat Complaints and Symptoms: Negative for: Chronic sinus problems or rhinitis Medical History: Negative for: Chronic sinus problems/congestion; Middle ear problems Respiratory Complaints and Symptoms: Negative for: Chronic or frequent coughs; Shortness of Breath Medical History: Negative for: Aspiration; Asthma;  Chronic Obstructive Pulmonary Disease (COPD); Pneumothorax; Sleep Apnea; Tuberculosis Cardiovascular Complaints and Symptoms: Negative for: Chest pain Medical History: Positive for: Hypertension Negative for: Angina; Arrhythmia; Congestive Heart Failure; Coronary Artery Disease; Deep Vein Thrombosis; Hypotension; Myocardial Infarction; Peripheral Arterial Disease; Peripheral Venous Disease; Phlebitis; Vasculitis  Gastrointestinal Complaints and Symptoms: Negative for: Frequent diarrhea; Nausea; Vomiting Medical History: Negative for: Cirrhosis ; Colitis; Crohns; Hepatitis A; Hepatitis B; Hepatitis C Endocrine Complaints and Symptoms: Negative for: Heat/cold intolerance Medical History: Negative for: Type I Diabetes; Type II Diabetes Genitourinary Complaints and Symptoms: Negative for: Frequent urination Medical History: Negative for: End Stage Renal Disease Integumentary (Skin) Complaints and Symptoms: Positive for: Wounds Medical History: Negative for: History of Burn Musculoskeletal Complaints and Symptoms: Negative for: Muscle Pain; Muscle Weakness Medical History: Negative for: Gout; Rheumatoid Arthritis; Osteoarthritis; Osteomyelitis Neurologic Complaints and Symptoms: Negative for: Numbness/parasthesias Medical History: Positive for: Dementia Negative for: Neuropathy; Quadriplegia; Paraplegia; Seizure Disorder Psychiatric Complaints and Symptoms: Negative for: Claustrophobia; Suicidal Medical History: Negative for: Anorexia/bulimia; Confinement Anxiety Hematologic/Lymphatic Medical History: Negative for: Anemia; Hemophilia; Human Immunodeficiency Virus; Lymphedema; Sickle Cell Disease Immunological Medical History: Negative for: Lupus Erythematosus; Raynauds; Scleroderma Oncologic Medical History: Negative for: Received Chemotherapy; Received Radiation Immunizations Pneumococcal Vaccine: Received Pneumococcal Vaccination: No Implantable  Devices None Family and Social History Cancer: Yes - Father; Diabetes: No; Heart Disease: No; Hereditary Spherocytosis: No; Hypertension: No; Kidney Disease: No; Lung Disease: No; Seizures: No; Stroke: Yes - Mother; Thyroid Problems: No; Tuberculosis: No; Former smoker; Marital Status - Married; Alcohol Use: Never; Drug Use: No History; Caffeine Use: Never; Financial Concerns: No; Food, Clothing or Shelter Needs: No; Support System Lacking: No; Transportation Concerns: No Electronic Signature(s) Signed: 07/23/2019 5:25:49 PM By: Marvin Najjar MD Signed: 08/21/2019 2:53:59 PM By: Yevonne Pax RN Entered By: Yevonne Pax on 07/23/2019 15:20:49 -------------------------------------------------------------------------------- SuperBill Details Patient Name: Date of Service: [name], [name] 07/23/2019 Medical Record [id number] Patient Account Number: [id number] Date of Birth/Sex: Treating RN: 10-24-1955 (62 y.o. [name] Primary Care Provider: Mila Reeves Other Clinician: Referring Provider: Treating Provider/Extender:Reeves, [name], [name] in Treatment: 0 Diagnosis Coding ICD-10 Codes Code Description [phone] Pressure ulcer of left hip, stage 3 G30.0 Alzheimer's disease with early onset Facility Procedures CPT4 Code: [id number] Description: 99213 - WOUND CARE VISIT-LEV 3 EST PT Modifier: 25 Quantity: 1 Physician Procedures CPT4 Code: 4696295 Description: WC PHYS LEVEL 3 NEW PT ICD-10 Diagnosis Description L89.223 Pressure ulcer of left hip, stage 3 G30.0 Alzheimer's disease with early onset Modifier: 25 Quantity: 1 CPT4 Code: [id number] Description: 11042 - WC PHYS SUBQ TISS 20 SQ CM ICD-10 Diagnosis Description L89.223 Pressure ulcer of left hip, stage 3 Modifier: Quantity: 1 Electronic Signature(s) Signed: 07/23/2019 5:25:49 PM By: Marvin Najjar MD Signed: 07/23/2019 5:46:53 PM By: Zandra Abts RN, BSN Entered By: Zandra Abts on 07/23/2019  17:23:52

## 2019-08-21 NOTE — Progress Notes (Signed)
[place] CellarSMITH, Alassane L. ([id number]) Visit Report for 08/06/2019 Arrival Information Details Patient Name: Date of Service: [place] CellarSMITH, Nello L. 08/06/2019 10:45 AM Medical Record [id number] Patient Account Number: [id number] Date of Birth/Sex: 1955-09-22 (63 y.o. M) Treating RN: Yevonne PaxEpps, Carrie Primary Care Anureet Bruington: Mila PalmerWolters, Sharon Other Clinician: Referring Liliana Dang: Treating Deshane Cotroneo/Extender:Stone III, Parke PoissonHoyt Wolters, Gardner CandleSharon Weeks in Treatment: 2 Visit Information History Since Last Visit All ordered tests and consults were completed: No Patient Arrived: Ambulatory Added or deleted any medications: No Arrival Time: 11:03 Any new allergies or adverse reactions: No Accompanied By: wife Had a fall or experienced change in No Transfer Assistance: None activities of daily living that may affect Patient Identification Verified: Yes risk of falls: Secondary Verification Process Yes Signs or symptoms of abuse/neglect since last No Completed: visito Patient Requires Transmission-Based No Hospitalized since last visit: No Precautions: Implantable device outside of the clinic excluding No Patient Has Alerts: No cellular tissue based products placed in the center since last visit: Has Dressing in Place as Prescribed: Yes Pain Present Now: No Electronic Signature(s) Signed: 08/21/2019 2:55:30 PM By: Yevonne PaxEpps, Carrie RN Entered By: Yevonne PaxEpps, Carrie on 08/06/2019 11:04:21 -------------------------------------------------------------------------------- Clinic Level of Care Assessment Details Patient Name: Date of Service:  CellarSMITH, Tadarrius L. 08/06/2019 10:45 AM Medical Record [id number] Patient Account Number: [id number] Date of Birth/Sex: 1955-09-22 (63 y.o. M) Treating RN: Zandra AbtsLynch, Shatara Primary Care Daqwan Dougal: Mila PalmerWolters, Sharon Other Clinician: Referring Eiliyah Reh: Treating Liron Eissler/Extender:Stone III, Parke PoissonHoyt Wolters, Gardner CandleSharon Weeks in Treatment: 2 Clinic Level of Care Assessment Items TOOL 4  Quantity Score X - Use when only an EandM is performed on FOLLOW-UP visit 1 0 ASSESSMENTS - Nursing Assessment / Reassessment X - Reassessment of Co-morbidities (includes updates in patient status) 1 10 X - Reassessment of Adherence to Treatment Plan 1 5 ASSESSMENTS - Wound and Skin Assessment / Reassessment X - Simple Wound Assessment / Reassessment - one wound 1 5 []  - Complex Wound Assessment / Reassessment - multiple wounds 0 []  - Dermatologic / Skin Assessment (not related to wound area) 0 ASSESSMENTS - Focused Assessment []  - Circumferential Edema Measurements - multi extremities 0 []  - Nutritional Assessment / Counseling / Intervention 0 []  - Lower Extremity Assessment (monofilament, tuning fork, pulses) 0 []  - Peripheral Arterial Disease Assessment (using hand held doppler) 0 ASSESSMENTS - Ostomy and/or Continence Assessment and Care []  - Incontinence Assessment and Management 0 []  - Ostomy Care Assessment and Management (repouching, etc.) 0 PROCESS - Coordination of Care X - Simple Patient / Family Education for ongoing care 1 15 []  - Complex (extensive) Patient / Family Education for ongoing care 0 X - Staff obtains ChiropractorConsents, Records, Test Results / Process Orders 1 10 []  - Staff telephones HHA, Nursing Homes / Clarify orders / etc 0 []  - Routine Transfer to another Facility (non-emergent condition) 0 []  - Routine Hospital Admission (non-emergent condition) 0 []  - New Admissions / Manufacturing engineernsurance Authorizations / Ordering NPWT, Apligraf, etc. 0 []  - Emergency Hospital Admission (emergent condition) 0 X - Simple Discharge Coordination 1 10 []  - Complex (extensive) Discharge Coordination 0 PROCESS - Special Needs []  - Pediatric / Minor Patient Management 0 []  - Isolation Patient Management 0 []  - Hearing / Language / Visual special needs 0 []  - Assessment of Community assistance (transportation, D/C planning, etc.) 0 []  - Additional assistance / Altered mentation 0 []  - Support  Surface(s) Assessment (bed, cushion, seat, etc.) 0 INTERVENTIONS - Wound Cleansing / Measurement X - Simple Wound Cleansing - one wound 1 5 []  - Complex Wound  Cleansing - multiple wounds 0 X - Wound Imaging (photographs - any number of wounds) 1 5 []  - Wound Tracing (instead of photographs) 0 X - Simple Wound Measurement - one wound 1 5 []  - Complex Wound Measurement - multiple wounds 0 INTERVENTIONS - Wound Dressings X - Small Wound Dressing one or multiple wounds 1 10 []  - Medium Wound Dressing one or multiple wounds 0 []  - Large Wound Dressing one or multiple wounds 0 X - Application of Medications - topical 1 5 []  - Application of Medications - injection 0 INTERVENTIONS - Miscellaneous []  - External ear exam 0 []  - Specimen Collection (cultures, biopsies, blood, body fluids, etc.) 0 []  - Specimen(s) / Culture(s) sent or taken to Lab for analysis 0 []  - Patient Transfer (multiple staff / / Similar devices) 0 []  - Simple Staple / Suture removal (25 or less) 0 []  - Complex Staple / Suture removal (26 or more) 0 []  - Hypo / Hyperglycemic Management (close monitor of Blood Glucose) 0 []  - Ankle / Brachial Index (ABI) - do not check if billed separately 0 X - Vital Signs 1 5 Has the patient been seen at the hospital within the last three years: Yes Total Score: 90 Level Of Care: New/Established - Level 3 Electronic Signature(s) Signed: 08/06/2019 2:18:43 PM By: RN, BSN Entered By: on 08/06/2019 13:32:55 -------------------------------------------------------------------------------- Encounter Discharge Information Details Patient Name: Date of Service: VIVIANO, BIR 08/06/2019 10:45 AM Medical Record Patient Account Number: Date of Birth/Sex: [birth year] (63 y.o. M) Treating RN: Primary Care Donalee Gaumond: Other Clinician: Referring Eon Zunker: Treating Waniya Hoglund/Extender:Stone III,  , 08/08/2019 in Treatment: 2 Encounter Discharge Information Items Discharge Condition: Stable Ambulatory Status: Ambulatory Discharge Destination: Home Transportation: Private Auto Accompanied By: wife Schedule Follow-up Appointment: Yes Clinical Summary of Care: Patient Declined Electronic Signature(s) Signed: 08/06/2019 2:18:43 PM By: Zandra Abts RN, BSN Entered By: 08/08/2019 on 08/06/2019 11:42:57 -------------------------------------------------------------------------------- Multi-Disciplinary Care Plan Details Patient Name: Date of Service: GILLIE, CRISCI 08/06/2019 10:45 AM Medical Record [id number] Patient Account Number: 09/08/1956 Date of Birth/Sex: 1955/12/15 (62 y.o. M) Treating RN: [name] Primary Care Reann Dobias: Parke Poisson Other Clinician: Referring Stewart Pimenta: Treating Pama Roskos/Extender:Stone III, Gardner Candle, 08/08/2019 in Treatment: 2 Active Inactive Pressure Nursing Diagnoses: Knowledge deficit related to causes and risk factors for pressure ulcer development Knowledge deficit related to management of pressures ulcers Potential for impaired tissue integrity related to pressure, friction, moisture, and shear Goals: Patient/caregiver will verbalize risk factors for pressure ulcer development Date Initiated: 07/23/2019 Target Resolution Date: 08/24/2019 Goal Status: Active Patient/caregiver will verbalize understanding of pressure ulcer management Date Initiated: 07/23/2019 Target Resolution Date: 08/24/2019 Goal Status: Active Interventions: Assess: immobility, friction, shearing, incontinence upon admission and as needed Assess offloading mechanisms upon admission and as needed Assess potential for pressure ulcer upon admission and as needed Provide education on pressure ulcers Notes: Wound/Skin Impairment Nursing Diagnoses: Impaired tissue integrity Knowledge deficit related to ulceration/compromised skin  integrity Goals: Patient/caregiver will verbalize understanding of skin care regimen Date Initiated: 07/23/2019 Target Resolution Date: 08/24/2019 Goal Status: Active Ulcer/skin breakdown will have a volume reduction of 30% by week 4 Date Initiated: 07/23/2019 Target Resolution Date: 08/24/2019 Goal Status: Active Interventions: Assess patient/caregiver ability to obtain necessary supplies Assess patient/caregiver ability to perform ulcer/skin care regimen upon admission and as needed Assess ulceration(s) every visit Provide education on ulcer and skin care Notes: Electronic Signature(s) Signed: 08/06/2019 2:18:43 PM By: Zandra Abts  Briant Cedar RN, BSN Entered By: Levan Hurst on 08/06/2019 13:32:18 -------------------------------------------------------------------------------- Pain Assessment Details Patient Name: Date of Service: ERAN, MISTRY 08/06/2019 10:45 AM Medical Record [id number] Patient Account Number: [id number] Date of Birth/Sex: [birth year] (62 y.o. M) Treating RN: Carlene Coria Primary Care Nagee Goates: Jonathon Jordan Other Clinician: Referring Evamarie Raetz: Treating Anandi Abramo/Extender:Stone III, Boykin Reaper, Mayford Knife in Treatment: 2 Active Problems Location of Pain Severity and Description of Pain Patient Has Paino No Site Locations Pain Management and Medication Current Pain Management: Electronic Signature(s) Signed: 08/21/2019 2:55:30 PM By: Carlene Coria RN Entered By: Carlene Coria on 08/06/2019 11:05:01 -------------------------------------------------------------------------------- Patient/Caregiver Education Details Patient Name: Matt Holmes 11/23/2020andnbsp10:45 Date of Service: AM Medical Record [id number] Number: Patient Account Number: [id number] Treating RN: 10-30-1955 (63 y.o. Levan Hurst Date of Birth/Gender: M) Other Clinician: Primary Care Treating Stephanie Acre Rhona Raider Physician: Physician/Extender: Referring  Physician: Orma Render in Treatment: 2 Education Assessment Education Provided To: Patient Education Topics Provided Pressure: Methods: Explain/Verbal Responses: State content correctly Wound/Skin Impairment: Methods: Explain/Verbal Responses: State content correctly Electronic Signature(s) Signed: 08/06/2019 2:18:43 PM By: Levan Hurst RN, BSN Entered By: Levan Hurst on 08/06/2019 13:32:32 -------------------------------------------------------------------------------- Wound Assessment Details Patient Name: Date of Service: KAREM, TOMASO 08/06/2019 10:45 AM Medical Record [id number] Patient Account Number: [id number] Date of Birth/Sex: 1956/06/02 (62 y.o. M) Treating RN: Carlene Coria Primary Care Azell Bill: Jonathon Jordan Other Clinician: Referring Buck Mcaffee: Treating Jahara Dail/Extender:Stone III, Boykin Reaper, Mayford Knife in Treatment: 2 Wound Status Wound Number: 1 Primary Etiology: Pressure Ulcer Wound Location: Left Trochanter Wound Status: Open Wounding Event: Gradually Appeared Comorbid History: Hypertension, Dementia Date Acquired: 06/17/2019 Weeks Of Treatment: 2 Clustered Wound: No Photos Wound Measurements Length: (cm) 1 % Reduction in Are Width: (cm) 0.5 % Reduction in Vol Depth: (cm) 0.1 Epithelialization: Area: (cm) 0.393 Tunneling: Volume: (cm) 0.039 Undermining: Wound Description Classification: Category/Stage III Foul Odor After C Wound Margin: Flat and Intact Slough/Fibrino Exudate Amount: Medium Exudate Type: Serosanguineous Exudate Color: red, brown Wound Bed Granulation Amount: Medium (34-66%) Granulation Quality: Pink, Pale Fascia Exposed: Necrotic Amount: Medium (34-66%) Fat Layer [place] Flakes Necrotic Quality: Adherent Slough Tendon Exposed: Muscle Exposed: Joint Exposed: Bone Exposed: Electronic Signature(s) Signed: 08/07/2019 2:17:56 PM By: Mikeal Hawthorne EMT/HBOT Signed: 08/21/2019 2:55:30 PM By: Carlene Coria RN Entered By: Mikeal Hawthorne on 11/24/ leansing: No Yes Exposed Structure No neous Tissue) Exposed: Yes No No No No 2020 07:43:33 a: 73.2% ume: 73.5% Small (1-33%) No No -------------------------------------------------------------------------------- Vitals Details Patient Name: Date of Service: BARAK, BIALECKI 08/06/2019 10:45 AM Medical Record [id number] Patient Account Number: [id number] Date of Birth/Sex: June 09, 1956 (62 y.o. M) Treating RN: Carlene Coria Primary Care Jawanna Dykman: Jonathon Jordan Other Clinician: Referring Sampson Self: Treating Tika Hannis/Extender:Stone III, Boykin Reaper, Mayford Knife in Treatment: 2 Vital Signs Time Taken: 11:04 Temperature (F): 97.6 Height (in): 66 Pulse (bpm): 79 Weight (lbs): 122 Respiratory Rate (breaths/min): 16 Body Mass Index (BMI): 19.7 Blood Pressure (mmHg): 128/67 Reference Range: 80 - 120 mg / dl Electronic Signature(s) Signed: 08/21/2019 2:55:30 PM By: Carlene Coria RN Entered By: Carlene Coria on 08/06/2019 11:04:55

## 2019-08-21 NOTE — Progress Notes (Signed)
LEONARD, FEIGEL ([id number]) Visit Report for 07/23/2019 Abuse/Suicide Risk Screen Details Patient Name: Date of Service: WILLIAMS, DIETRICK 07/23/2019 2:45 PM Medical Record [id number] Patient Account Number: [id number] Date of Birth/Sex: Treating RN: 20-Apr-1956 (62 y.o. Judie Petit) Yevonne Pax Primary Care Aoi Kouns: [name] Other Clinician: Referring Juliah Scadden: Treating Jaliana Medellin/Extender:Robson, Era Skeen, Gardner Candle in Treatment: 0 Abuse/Suicide Risk Screen Items Answer ABUSE RISK SCREEN: Has anyone close to you tried to hurt or harm you recentlyo No Do you feel uncomfortable with anyone in your familyo No Has anyone forced you do things that you didnt want to doo No Electronic Signature(s) Signed: 08/21/2019 2:53:59 PM By: Yevonne Pax RN Entered By: Yevonne Pax on 07/23/2019 15:20:57 -------------------------------------------------------------------------------- Activities of Daily Living Details Patient Name: Date of Service: KELSON, QUEENAN 07/23/2019 2:45 PM Medical Record [id number] Patient Account Number: [id number] Date of Birth/Sex: Treating RN: 11-May-1956 (62 y.o. Melonie [place] Primary Care Colston Pyle: [name] Other Clinician: Referring Vladislav Axelson: Treating Antania Hoefling/Extender:Robson, Era Skeen, Gardner Candle in Treatment: 0 Activities of Daily Living Items Answer Activities of Daily Living (Please select one for each item) Drive Automobile Not Able Take Medications Not Able Use Telephone Not Able Care for Appearance Not Able Use Toilet Not Able Mady Haagensen / Shower Not Able Dress Self Not Able Feed Self Need Assistance Walk Not Able Get In / Out Bed Not Able Housework Not Able Prepare Meals Not Able Handle Money Not Able Shop for Self Not Able Electronic Signature(s) Signed: 08/21/2019 2:53:59 PM By: Yevonne Pax RN Entered By: Yevonne Pax on 07/23/2019  15:21:50 -------------------------------------------------------------------------------- Education Screening Details Patient Name: Date of Service: RAINN, BULLINGER 07/23/2019 2:45 PM Medical Record [id number] Patient Account Number: [id number] Date of Birth/Sex: Treating RN: 1956-01-04 (62 y.o. Melonie [place] Primary Care Olanda Boughner: [name] Other Clinician: Referring Sharea Guinther: Treating Fredrico Beedle/Extender:Robson, Era Skeen, Gardner Candle in Treatment: 0 Primary Learner Assessed: Patient Learning Preferences/Education Level/Primary Language Learning Preference: Explanation Highest Education Level: College or Above Preferred Language: English Cognitive Barrier Language Barrier: No Translator Needed: No Memory Deficit: No Emotional Barrier: No Cultural/Religious Beliefs Affecting Medical Care: No Physical Barrier Impaired Vision: Yes Glasses Impaired Hearing: No Decreased Hand dexterity: No Knowledge/Comprehension Knowledge Level: High Comprehension Level: High Ability to understand written High instructions: Ability to understand verbal High instructions: Motivation Anxiety Level: Calm Cooperation: Cooperative Education Importance: Acknowledges Need Interest in Health Problems: Asks Questions Perception: Coherent Willingness to Engage in Self- High Management Activities: Readiness to Engage in Self- High Management Activities: Electronic Signature(s) Signed: 08/21/2019 2:53:59 PM By: Yevonne Pax RN Entered By: Yevonne Pax on 07/23/2019 15:23:06 -------------------------------------------------------------------------------- Fall Risk Assessment Details Patient Name: Date of Service:  Cellar 07/23/2019 2:45 PM Medical Record [id number] Patient Account Number: [id number] Date of Birth/Sex: Treating RN: 02-16-1956 (62 y.o. Judie Petit) Yevonne Pax Primary Care Zyaira Vejar: [name] Other Clinician: Referring Errik Mitchelle: Treating  Naama Sappington/Extender:Robson, Era Skeen, Gardner Candle in Treatment: 0 Fall Risk Assessment Items Have you had 2 or more falls in the last 12 monthso 0 No Have you had any fall that resulted in injury in the last 12 monthso 0 No FALLS RISK SCREEN History of falling - immediate or within 3 months 0 No Secondary diagnosis (Do you have 2 or more medical diagnoseso) 0 No Ambulatory aid None/bed rest/wheelchair/nurse 0 No Crutches/cane/walker 0 No Furniture 0 No Intravenous therapy Access/Saline/Heparin Lock 0 No Weak (short steps with or without shuffle, stooped but able to lift head 0 No while walking, may seek support from furniture) Impaired (short steps with shuffle, may  have difficulty arising from chair, 0 No head down, impaired balance) Mental Status Oriented to own ability 0 No Overestimates or forgets limitations 0 No Risk Level: Low Risk Score: 0 Electronic Signature(s) Signed: 08/21/2019 2:53:59 PM By: Carlene Coria RN Entered By: Carlene Coria on 07/23/2019 15:23:15 -------------------------------------------------------------------------------- Nutrition Risk Screening Details Patient Name: Date of Service: TAMAJ, JURGENS 07/23/2019 2:45 PM Medical Record [id number] Patient Account Number: [id number] Date of Birth/Sex: Treating RN: Apr 17, 1956 (62 y.o. Jerilynn Mages) Carlene Coria Primary Care Audi Wettstein: Jonathon Jordan Other Clinician: Referring Caitlinn Klinker: Treating Espyn Radwan/Extender:Robson, Gilmer Mor, Mayford Knife in Treatment: 0 Height (in): 66 Weight (lbs): 122 Body Mass Index (BMI): 19.7 Nutrition Risk Screening Items Score Screening NUTRITION RISK SCREEN: I have an illness or condition that made me change the kind and/or 0 No amount of food I eat I eat fewer than two meals per day 0 No I eat few fruits and vegetables, or milk products 0 No I have three or more drinks of beer, liquor or wine almost every day 0 No I have tooth or mouth problems that make it  hard for me to eat 0 No I don't always have enough money to buy the food I need 0 No I eat alone most of the time 0 No I take three or more different prescribed or over-the-counter drugs a day 1 Yes 2 Yes Without wanting to, I have lost or gained 10 pounds in the last six months I am not always physically able to shop, cook and/or feed myself 2 Yes Nutrition Protocols Good Risk Protocol Provide education on Moderate Risk Protocol 0 nutrition High Risk Proctocol Risk Level: Moderate Risk Score: 5 Electronic Signature(s) Signed: 08/21/2019 2:53:59 PM By: Carlene Coria RN Entered By: Carlene Coria on 07/23/2019 15:24:06

## 2019-08-21 NOTE — Progress Notes (Signed)
[name], [name] ([id number]) Visit Report for 08/20/2019 HPI Details Patient Name: Date of Service: [name], [name] 08/20/2019 3:30 PM Medical Record [id number] Patient Account Number: [id number] Date of Birth/Sex: Treating RN: Jan 09, 1956 (63 y.o. M) Primary Care Provider: [name] Other Clinician: Referring Provider: Treating Provider/Extender:, Era Skeen, Gardner Candle in Treatment: 4 History of Present Illness HPI Description: Admission 07/23/2019 This is an unfortunate 63 year old man who has early onset Alzheimer's for 6 years. He is now at the severe stages of this. He is essentially nonverbal somewhat unsteady on his feet and doubly and incontinent. He is cared for at home by his wife. He goes for adult daycare 5 days a week. He has had a wound on the left greater trochanter since early October. They have been using Vaseline gauze. He has had 2 rounds of antibiotics including Keflex and Bactrim. The wound is complicated by the fact he insists on sleeping on his left side at night for reasons that are not clear and he will often sleep on this all night. He paces during the day. Past medical history Alzheimer's disease with coexistent anxiety, wandering 08/06/2019 on evaluation today patient actually appears to be doing much better at this point with regard to the wound. He did get the gel overlay he still has some redness around the edges of the wound but fortunately there is no signs of active infection at this time. No fevers, chills, nausea, vomiting, or diarrhea. 08/20/2019; the patient returns to clinic today with the area on the left greater trochanter epithelialized. It still looks like there is some degree of pressure access on this area but there is no open wound Electronic Signature(s) Signed: 08/21/2019 10:01:17 AM By: Baltazar Najjar MD Entered By: Baltazar Najjar on 08/20/2019  17:16:25 -------------------------------------------------------------------------------- Physical Exam Details Patient Name: Date of Service: [name], [name] 08/20/2019 3:30 PM Medical Record [id number] Patient Account Number: [id number] Date of Birth/Sex: Treating RN: Sep 10, 1956 (62 y.o. M) Primary Care Provider: [name] Other Clinician: Referring Provider: Treating Provider/Extender:, Era Skeen, Gardner Candle in Treatment: 4 Integumentary (Hair, Skin) No tenderness around the wound. Notes Wound exam; wound is epithelialized however I believe there is some underlying stage I pressure injury. I have emphasized to the patient's wife that is much as she can she needs to offload this area going forward. Electronic Signature(s) Signed: 08/21/2019 10:01:17 AM By: Baltazar Najjar MD Entered By: Baltazar Najjar on 08/20/2019 17:17:04 -------------------------------------------------------------------------------- Physician Orders Details Patient Name: Date of Service: [name], [name] 08/20/2019 3:30 PM Medical Record [id number] Patient Account Number: [id number] Date of Birth/Sex: Treating RN: [birth year] (62 y.o. Elizebeth Koller Primary Care Provider: [name] Other Clinician: Referring Provider: Treating Provider/Extender:, Era Skeen, Gardner Candle in Treatment: 4 Verbal / Phone Orders: No Diagnosis Coding ICD-10 Coding Code Description [phone] Pressure ulcer of left hip, stage 3 G30.0 Alzheimer's disease with early onset Discharge From [facility] Primary Wound Dressing Other: - keep left hip padded for protection Off-Loading Gel mattress overlay (Group 1) Turn and reposition every 2 hours Electronic Signature(s) Signed: 08/20/2019 5:51:44 PM By: Zandra Abts RN, BSN Signed: 08/21/2019 10:01:17 AM By: Baltazar Najjar MD Entered By: Zandra Abts on 08/20/2019  16:21:14 -------------------------------------------------------------------------------- Problem List Details Patient Name: Date of Service: [name], [name] 08/20/2019 3:30 PM Medical Record [id number] Patient Account Number: [id number] Date of Birth/Sex: Treating RN: October 06, 1955 (62 y.o. Elizebeth Koller Primary Care Provider: Other Clinician: [name] Referring Provider: Treating Provider/Extender:, Era Skeen, Gardner Candle in Treatment:  4 Active Problems ICD-10 Evaluated Encounter Code Description Active Date Today Diagnosis L89.223 Pressure ulcer of left hip, stage 3 07/23/2019 No Yes G30.0 Alzheimer's disease with early onset 07/23/2019 No Yes Inactive Problems Resolved Problems Electronic Signature(s) Signed: 08/21/2019 10:01:17 AM By: Linton Ham MD Entered By: Linton Ham on 08/20/2019 17:15:29 -------------------------------------------------------------------------------- Progress Note Details Patient Name: Date of Service: [name] 08/20/2019 3:30 PM Medical Record [id number] Patient Account Number: [id number] Date of Birth/Sex: Treating RN: 08-Nov-1955 (63 y.o. M) Primary Care Provider: Jonathon Jordan Other Clinician: Referring Provider: Treating Provider/Extender:, Gilmer Mor, Mayford Knife in Treatment: 4 Subjective History of Present Illness (HPI) Admission 07/23/2019 This is an unfortunate 63 year old man who has early onset Alzheimer's for 6 years. He is now at the severe stages of this. He is essentially nonverbal somewhat unsteady on his feet and doubly and incontinent. He is cared for at home by his wife. He goes for adult daycare 5 days a week. He has had a wound on the left greater trochanter since early October. They have been using Vaseline gauze. He has had 2 rounds of antibiotics including Keflex and Bactrim. The wound is complicated by the fact he insists on sleeping on his left side at  night for reasons that are not clear and he will often sleep on this all night. He paces during the day. Past medical history Alzheimer's disease with coexistent anxiety, wandering 08/06/2019 on evaluation today patient actually appears to be doing much better at this point with regard to the wound. He did get the gel overlay he still has some redness around the edges of the wound but fortunately there is no signs of active infection at this time. No fevers, chills, nausea, vomiting, or diarrhea. 08/20/2019; the patient returns to clinic today with the area on the left greater trochanter epithelialized. It still looks like there is some degree of pressure access on this area but there is no open wound Objective Constitutional Vitals Time Taken: 3:40 PM, Height: 66 in, Weight: 122 lbs, BMI: 19.7, Temperature: 98.2 F, Pulse: 115 bpm, Respiratory Rate: 18 breaths/min, Blood Pressure: 124/99 mmHg. General Notes: Wound exam; wound is epithelialized however I believe there is some underlying stage I pressure injury. I have emphasized to the patient's wife that is much as she can she needs to offload this area going forward. Integumentary (Hair, Skin) No tenderness around the wound. Wound #1 status is Healed - Epithelialized. Original cause of wound was Gradually Appeared. The wound is located on the Left Trochanter. The wound measures 0cm length x 0cm width x 0cm depth; 0cm^2 area and 0cm^3 volume. There is no tunneling or undermining noted. There is a none present amount of drainage noted. The wound margin is flat and intact. There is no granulation within the wound bed. There is no necrotic tissue within the wound bed. Assessment Active Problems ICD-10 Pressure ulcer of left hip, stage 3 Alzheimer's disease with early onset Plan Discharge From [facility] Services: Discharge from [place] Primary Wound Dressing: Other: - keep left hip padded for protection Off-Loading: Gel mattress  overlay (Group 1) Turn and reposition every 2 hours #1 we need to keep border foam over this area to protect it as well as offloading it wrote rigorously. 2. He still has stage I pressure changes around the wound but it is epithelialized. 3. Follow-up here as needed he can be discharged Electronic Signature(s) Signed: 08/21/2019 10:01:17 AM By: Linton Ham MD Entered By: Linton Ham on 08/20/2019 17:20:46 -------------------------------------------------------------------------------- SuperBill  Details Patient Name: Date of Service: [name], Marvin L. 08/20/2019 Medical Record [id number] Patient Account Number: [id number] Date of Birth/Sex: Treating RN: 12/30/1955 (63 y.o. M) Primary Care Provider: Mila PalmerWolters, Sharon Other Clinician: Referring Provider: Treating Provider/Extender:, Era Skeen Wolters, Gardner CandleSharon Weeks in Treatment: 4 Diagnosis Coding ICD-10 Codes Code Description [id number] Pressure ulcer of left hip, stage 3 G30.0 Alzheimer's disease with early onset Facility Procedures CPT4 Code: [id number] Description: [id number] - WOUND CARE VISIT-LEV 2 EST PT Modifier: Quantity: 1 Physician Procedures CPT4 Code: [id number] Description: [id number] - WC PHYS LEVEL 2 - EST PT ICD-10 Diagnosis Description L89.223 Pressure ulcer of left hip, stage 3 Modifier: Quantity: 1 Electronic Signature(s) Signed: 08/20/2019 5:51:44 PM By: Zandra AbtsLynch, Shatara RN, BSN Signed: 08/21/2019 10:01:17 AM By: Baltazar Najjarobson,  MD Entered By: Zandra AbtsLynch, Shatara on 08/20/2019 17:39:28

## 2019-08-21 NOTE — Progress Notes (Signed)
[name], [name] ([id number]) Visit Report for 07/23/2019 Allergy List Details Patient Name: Date of Service: [name], [name] 07/23/2019 2:45 PM Medical Record [id number] Patient Account Number: [id number] Date of Birth/Sex: Treating RN: 07/17/1956 (62 y.o. Judie Petit) Yevonne Pax Primary Care Teala Daffron: [name] Other Clinician: Referring Tasnim Balentine: Treating Jannessa Ogden/Extender:Robson, Era Skeen, Gardner Candle in Treatment: 0 Allergies Active Allergies No Known Allergies Allergy Notes Electronic Signature(s) Signed: 08/21/2019 2:53:59 PM By: Yevonne Pax RN Entered By: Yevonne Pax on 07/23/2019 15:17:50 -------------------------------------------------------------------------------- Arrival Information Details Patient Name: Date of Service: [name], [name] 07/23/2019 2:45 PM Medical Record [id number] Patient Account Number: [id number] Date of Birth/Sex: Treating RN: 05/25/1956 (62 y.o. Melonie [place] Primary Care Mariel Gaudin: [name] Other Clinician: Referring Jonai Weyland: Treating Chanceler Pullin/Extender:Robson, Era Skeen, Gardner Candle in Treatment: 0 Visit Information Patient Arrived: Ambulatory Arrival Time: 15:12 Accompanied By: self Transfer Assistance: None Patient Identification Verified: Yes Secondary Verification Process Completed: Yes Patient Requires Transmission-Based No Precautions: Patient Has Alerts: No Electronic Signature(s) Signed: 08/21/2019 2:53:59 PM By: Yevonne Pax RN Entered By: Yevonne Pax on 07/23/2019 15:15:52 -------------------------------------------------------------------------------- Clinic Level of Care Assessment Details Patient Name: Date of Service: [name], [name] 07/23/2019 2:45 PM Medical Record [id number] Patient Account Number: [id number] Date of Birth/Sex: Treating RN: 1956/03/16 (63 y.o. Elizebeth Koller Primary Care Aeryn Medici: [name] Other Clinician: Referring Leor Whyte: Treating  Anwyn Kriegel/Extender:Robson, Era Skeen, Gardner Candle in Treatment: 0 Clinic Level of Care Assessment Items TOOL 1 Quantity Score X - Use when EandM and Procedure is performed on INITIAL visit 1 0 ASSESSMENTS - Nursing Assessment / Reassessment X - General Physical Exam (combine w/ comprehensive assessment (listed just below) 1 20 when performed on new pt. evals) X - Comprehensive Assessment (HX, ROS, Risk Assessments, Wounds Hx, etc.) 1 25 ASSESSMENTS - Wound and Skin Assessment / Reassessment []  - Dermatologic / Skin Assessment (not related to wound area) 0 ASSESSMENTS - Ostomy and/or Continence Assessment and Care []  - Incontinence Assessment and Management 0 []  - Ostomy Care Assessment and Management (repouching, etc.) 0 PROCESS - Coordination of Care X - Simple Patient / Family Education for ongoing care 1 15 []  - Complex (extensive) Patient / Family Education for ongoing care 0 X - Staff obtains , Records, Test Results / Process Orders 1 10 []  - Staff telephones HHA, Nursing Homes / Clarify orders / etc 0 []  - Routine Transfer to another Facility (non-emergent condition) 0 []  - Routine Hospital Admission (non-emergent condition) 0 X - New Admissions / / Ordering NPWT, Apligraf, etc. 1 15 []  - Emergency Hospital Admission (emergent condition) 0 PROCESS - Special Needs []  - Pediatric / Minor Patient Management 0 []  - Isolation Patient Management 0 []  - Hearing / Language / Visual special needs 0 []  - Assessment of Community assistance (transportation, D/C planning, etc.) 0 []  - Additional assistance / Altered mentation 0 []  - Support Surface(s) Assessment (bed, cushion, seat, etc.) 0 INTERVENTIONS - Miscellaneous []  - External ear exam 0 []  - Patient Transfer (multiple staff / / Similar devices) 0 []  - Simple Staple / Suture removal (25 or less) 0 []  - Complex Staple / Suture removal (26 or more) 0 []  - Hypo/Hyperglycemic  Management (do not check if billed separately) 0 []  - Ankle / Brachial Index (ABI) - do not check if billed separately 0 Has the patient been seen at the hospital within the last three years: Yes Total Score: 85 Level Of Care: New/Established - Level 3 Electronic Signature(s) Signed: 07/23/2019 5:46:53 PM By:  Levan Hurst RN, BSN Entered By: Levan Hurst on 07/23/2019 17:23:40 -------------------------------------------------------------------------------- Encounter Discharge Information Details Patient Name: Date of Service: [name], [name] 07/23/2019 2:45 PM Medical Record [id number] Patient Account Number: [id number] Date of Birth/Sex: Treating RN: 1955-11-02 (63 y.o. Janyth Contes Primary Care Genevive Printup: Jonathon Jordan Other Clinician: Referring Caidance Sybert: Treating Glenisha Gundry/Extender:Robson, Gilmer Mor, Mayford Knife in Treatment: 0 Encounter Discharge Information Items Post Procedure Vitals Discharge Condition: Stable Temperature (F): 98.5 Ambulatory Status: Ambulatory Pulse (bpm): 54 Discharge Destination: Home Respiratory Rate (breaths/min): 18 Transportation: Private Auto Blood Pressure (mmHg): 105/71 Accompanied By: wife Schedule Follow-up Appointment: Yes Clinical Summary of Care: Patient Declined Electronic Signature(s) Signed: 07/23/2019 5:46:53 PM By: Levan Hurst RN, BSN Entered By: Levan Hurst on 07/23/2019 17:25:00 -------------------------------------------------------------------------------- Multi Wound Chart Details Patient Name: Date of Service: [name] 07/23/2019 2:45 PM Medical Record [id number] Patient Account Number: [id number] Date of Birth/Sex: Treating RN: [birth year] (63 y.o. Janyth Contes Primary Care Rishan Oyama: Jonathon Jordan Other Clinician: Referring Nolie Bignell: Treating Alexzander Dolinger/Extender:Robson, Gilmer Mor, Mayford Knife in Treatment: 0 Vital Signs Height(in): 66 Pulse(bpm): 54 Weight(lbs):  122 Blood Pressure(mmHg): 105/71 Body Mass Index(BMI): 20 Temperature(F): 98.5 Respiratory 18 Rate(breaths/min): Photos: [1:No Photos] [N/A:N/A] Wound Location: [1:Left Trochanter] [N/A:N/A] Wounding Event: [1:Gradually Appeared] [N/A:N/A] Primary Etiology: [1:Pressure Ulcer] [N/A:N/A] Comorbid History: [1:Hypertension, Dementia] [N/A:N/A] Date Acquired: [1:06/17/2019] [N/A:N/A] Weeks of Treatment: [1:0] [N/A:N/A] Wound Status: [1:Open] [N/A:N/A] Measurements L x W x D [1:1.7x1.1x0.1] [N/A:N/A] (cm) Area (cm) : [1:1.469] [N/A:N/A] Volume (cm) : [1:0.147] [N/A:N/A] % Reduction in Area: [1:0.00%] [N/A:N/A] % Reduction in Volume: [1:0.00%] [N/A:N/A] Classification: [1:Category/Stage III] [N/A:N/A] Exudate Amount: [1:Medium] [N/A:N/A] Exudate Type: [1:Serosanguineous] [N/A:N/A] Exudate Color: [1:red, brown] [N/A:N/A] Wound Margin: [1:Flat and Intact] [N/A:N/A] Granulation Amount: [1:Medium (34-66%)] [N/A:N/A] Granulation Quality: [1:Pink, Pale] [N/A:N/A] Necrotic Amount: [1:Medium (34-66%)] [N/A:N/A] Exposed Structures: [1:Fat Layer (Subcutaneous Tissue) Exposed: Yes Fascia: No Tendon: No Muscle: No Joint: No Bone: No] [N/A:N/A] Epithelialization: [1:None] [N/A:N/A] Debridement: [1:Debridement - Excisional] [N/A:N/A] Pre-procedure [1:15:48] [N/A:N/A] Verification/Time Out Taken: Pain Control: [1:Other] [N/A:N/A] Tissue Debrided: [1:Subcutaneous, Slough] [N/A:N/A] Level: [1:Skin/Subcutaneous Tissue] [N/A:N/A] Debridement Area (sq cm):1.87 [N/A:N/A] Instrument: [1:Curette] [N/A:N/A] Bleeding: [1:Moderate] [N/A:N/A] Hemostasis Achieved: [1:Pressure] [N/A:N/A] Procedural Pain: [1:0] [N/A:N/A] Post Procedural Pain: [1:0] [N/A:N/A] Debridement Treatment Procedure was tolerated [N/A:N/A] Response: [1:well] Post Debridement [1:1.7x1.1x0.1] [N/A:N/A] Measurements L x W x D (cm) Post Debridement [1:0.147] [N/A:N/A] Volume: (cm) Post Debridement Stage: Category/Stage II  [N/A:N/A N/A] Treatment Notes Electronic Signature(s) Signed: 07/23/2019 5:25:49 PM By: Linton Ham MD Signed: 07/23/2019 5:46:53 PM By: Levan Hurst RN, BSN Entered By: Linton Ham on 07/23/2019 16:06:03 -------------------------------------------------------------------------------- Multi-Disciplinary Care Plan Details Patient Name: Date of Service: [name], [name] 07/23/2019 2:45 PM Medical Record [id number] Patient Account Number: [id number] Date of Birth/Sex: Treating RN: [birth year] (63 y.o. Janyth Contes Primary Care Arshad Oberholzer: Jonathon Jordan Other Clinician: Referring Massiah Longanecker: Treating Nanette Wirsing/Extender:Robson, Gilmer Mor, Mayford Knife in Treatment: 0 Active Inactive Pressure Nursing Diagnoses: Knowledge deficit related to causes and risk factors for pressure ulcer development Knowledge deficit related to management of pressures ulcers Potential for impaired tissue integrity related to pressure, friction, moisture, and shear Goals: Patient/caregiver will verbalize risk factors for pressure ulcer development Date Initiated: 07/23/2019 Target Resolution Date: 08/24/2019 Goal Status: Active Patient/caregiver will verbalize understanding of pressure ulcer management Date Initiated: 07/23/2019 Target Resolution Date: 08/24/2019 Goal Status: Active Interventions: Assess: immobility, friction, shearing, incontinence upon admission and as needed Assess offloading mechanisms upon admission and as needed Assess potential for pressure ulcer upon admission and as needed Provide education on pressure ulcers Notes: Wound/Skin  Impairment Nursing Diagnoses: Impaired tissue integrity Knowledge deficit related to ulceration/compromised skin integrity Goals: Patient/caregiver will verbalize understanding of skin care regimen Date Initiated: 07/23/2019 Target Resolution Date: 08/24/2019 Goal Status: Active Ulcer/skin breakdown will have a volume reduction of  30% by week 4 Date Initiated: 07/23/2019 Target Resolution Date: 08/24/2019 Goal Status: Active Interventions: Assess patient/caregiver ability to obtain necessary supplies Assess patient/caregiver ability to perform ulcer/skin care regimen upon admission and as needed Assess ulceration(s) every visit Provide education on ulcer and skin care Notes: Electronic Signature(s) Signed: 07/23/2019 5:46:53 PM By: Zandra AbtsLynch, Shatara RN, BSN Entered By: Zandra AbtsLynch, Shatara on 07/23/2019 17:23:08 -------------------------------------------------------------------------------- Pain Assessment Details Patient Name: Date of Service: [name], Marvin L. 07/23/2019 2:45 PM Medical Record [id number] Patient Account Number: [id number] Date of Birth/Sex: Treating RN: 03/17/1956 (62 y.o. Melonie FloridaM) Epps, Carrie Primary Care Micaila Ziemba: Mila PalmerWolters, Sharon Other Clinician: Referring Yania Bogie: Treating Shedrick Sarli/Extender:Robson, Era SkeenMichael Wolters, Gardner CandleSharon Weeks in Treatment: 0 Active Problems Location of Pain Severity and Description of Pain Patient Has Paino No Site Locations Pain Management and Medication Current Pain Management: Electronic Signature(s) Signed: 08/21/2019 2:53:59 PM By: Yevonne PaxEpps, Carrie RN Entered By: Yevonne PaxEpps, Carrie on 07/23/2019 15:29:41 -------------------------------------------------------------------------------- Patient/Caregiver Education Details Patient Name: Date of Service: [name], Marvin L. 11/9/2020andnbsp2:45 PM Medical Record [id number] Patient Account Number: [id number] Date of Birth/Gender: 03/17/1956 (62 y.o. M) Treating RN: Zandra AbtsLynch, Shatara Primary Care Physician: Mila PalmerWolters, Sharon Other Clinician: Referring Physician: Treating Physician/Extender:Robson, Era SkeenMichael Wolters, Gardner CandleSharon Weeks in Treatment: 0 Education Assessment Education Provided To: Patient Education Topics Provided Pressure: Methods: Explain/Verbal Responses: State content correctly Wound/Skin Impairment: Methods:  Explain/Verbal Responses: State content correctly Electronic Signature(s) Signed: 07/23/2019 5:46:53 PM By: Zandra AbtsLynch, Shatara RN, BSN Entered By: Zandra AbtsLynch, Shatara on 07/23/2019 17:23:16 -------------------------------------------------------------------------------- Wound Assessment Details Patient Name: Date of Service: [name], Marvin L. 07/23/2019 2:45 PM Medical Record [id number] Patient Account Number: [id number] Date of Birth/Sex: Treating RN: 03/17/1956 (62 y.o. Melonie FloridaM) Epps, Carrie Primary Care Stan Cantave: Mila PalmerWolters, Sharon Other Clinician: Referring Taygen Acklin: Treating Elias Dennington/Extender:Robson, Era SkeenMichael Wolters, Gardner CandleSharon Weeks in Treatment: 0 Wound Status Wound Number: 1 Primary Etiology: Pressure Ulcer Wound Location: Left Trochanter Wound Status: Open Wounding Event: Gradually Appeared Comorbid History: Hypertension, Dementia Date Acquired: 06/17/2019 Weeks Of Treatment: 0 Clustered Wound: No Photos Wound Measurements Length: (cm) 1.7 % Reduction in Ar Width: (cm) 1.1 % Reduction in Vo Depth: (cm) 0.1 Epithelialization Area: (cm) 1.469 Tunneling: Volume: (cm) 0.147 Undermining: Wound Description Classification: Category/Stage III Foul Odor After C Wound Margin: Flat and Intact Slough/Fibrino Exudate Amount: Medium Exudate Type: Serosanguineous Exudate Color: red, brown Wound Bed Granulation Amount: Medium (34-66%) Granulation Quality: Pink, Pale Fascia Exposed: Necrotic Amount: Medium (34-66%) Fat Layer (Subcut Necrotic Quality: Adherent Slough Tendon Exposed: Muscle Exposed: Joint Exposed: Bone Exposed: leansing: No Yes Exposed Structure No aneous Tissue) Exposed: Yes No No No No ea: 0% lume: 0% : None No No Electronic Signature(s) Signed: 07/25/2019 4:27:20 PM By: Benjaman KindlerJones, Dedrick EMT/HBOT Signed: 08/21/2019 2:53:59 PM By: Yevonne PaxEpps, Carrie RN Previous Signature: 07/23/2019 5:46:53 PM Version By: Zandra AbtsLynch, Shatara RN, BSN Entered By: Benjaman KindlerJones, Dedrick on  07/25/2019 13:56:09 -------------------------------------------------------------------------------- Vitals Details Patient Name: Date of Service: [name], Marvin L. 07/23/2019 2:45 PM Medical Record [id number] Patient Account Number: [id number] Date of Birth/Sex: Treating RN: 03/17/1956 (62 y.o. Lina SarM) Epps, Lyla Sonarrie Primary Care Marquesha Robideau: Mila PalmerWolters, Sharon Other Clinician: Referring Shirlyn Savin: Treating Gerrianne Aydelott/Extender:Robson, Era SkeenMichael Wolters, Gardner CandleSharon Weeks in Treatment: 0 Vital Signs Time Taken: 15:16 Temperature (F): 98.5 Height (in): 66 Pulse (bpm): 54 Source: Stated Respiratory Rate (breaths/min): 18 Weight (lbs): 122 Blood Pressure (mmHg): 105/71 Source: Stated Reference Range:  80 - 120 mg / dl Body Mass Index (BMI): 19.7 Electronic Signature(s) Signed: 08/21/2019 2:53:59 PM By: Yevonne Pax RN Entered By: Yevonne Pax on 07/23/2019 15:17:34

## 2019-08-22 NOTE — Progress Notes (Signed)
LEXANDER, TREMBLAY ([id number]) Visit Report for 08/20/2019 Arrival Information Details Patient Name: Date of Service: [name], [name] 08/20/2019 3:30 PM Medical Record [id number] Patient Account Number: [id number] Date of Birth/Sex: Treating RN: 1956-04-19 (63 y.o. [name] Primary Care Teven Mittman: Jonathon Jordan Other Clinician: Referring Shauntavia Brackin: Treating Lorrin Bodner/Extender:Robson, Gilmer Mor, Mayford Knife in Treatment: 4 Visit Information History Since Last Visit Added or deleted any medications: No Patient Arrived: Ambulatory Any new allergies or adverse reactions: No Arrival Time: 15:43 Had a fall or experienced change in No Accompanied By: wife activities of daily living that may affect Transfer Assistance: None risk of falls: Patient Identification Verified: Yes Signs or symptoms of abuse/neglect since last No Secondary Verification Process Completed: Yes visito Patient Requires Transmission-Based No Hospitalized since last visit: No Precautions: Implantable device outside of the clinic excluding No Patient Has Alerts: No cellular tissue based products placed in the center since last visit: Has Dressing in Place as Prescribed: Yes Pain Present Now: No Electronic Signature(s) Signed: 08/22/2019 12:04:56 PM By: Kela Millin Entered By: Kela Millin on 08/20/2019 15:43:31 -------------------------------------------------------------------------------- Clinic Level of Care Assessment Details Patient Name: Date of Service: [name], [name] 08/20/2019 3:30 PM Medical Record [id number] Patient Account Number: [id number] Date of Birth/Sex: Treating RN: Oct 25, 1955 (63 y.o. Janyth Contes Primary Care Mattheu Brodersen: Jonathon Jordan Other Clinician: Referring Evo Aderman: Treating Mandalyn Pasqua/Extender:Robson, Gilmer Mor, Mayford Knife in Treatment: 4 Clinic Level of Care Assessment Items TOOL 4 Quantity Score []  - Use when only an  EandM is performed on FOLLOW-UP visit 0 ASSESSMENTS - Nursing Assessment / Reassessment X - Reassessment of Co-morbidities (includes updates in patient status) 1 10 X - Reassessment of Adherence to Treatment Plan 1 5 ASSESSMENTS - Wound and Skin Assessment / Reassessment X - Simple Wound Assessment / Reassessment - one wound 1 5 []  - Complex Wound Assessment / Reassessment - multiple wounds 0 []  - Dermatologic / Skin Assessment (not related to wound area) 0 ASSESSMENTS - Focused Assessment []  - Circumferential Edema Measurements - multi extremities 0 []  - Nutritional Assessment / Counseling / Intervention 0 []  - Lower Extremity Assessment (monofilament, tuning fork, pulses) 0 []  - Peripheral Arterial Disease Assessment (using hand held doppler) 0 ASSESSMENTS - Ostomy and/or Continence Assessment and Care []  - Incontinence Assessment and Management 0 []  - Ostomy Care Assessment and Management (repouching, etc.) 0 PROCESS - Coordination of Care X - Simple Patient / Family Education for ongoing care 1 15 []  - Complex (extensive) Patient / Family Education for ongoing care 0 X - Staff obtains Programmer, systems, Records, Test Results / Process Orders 1 10 []  - Staff telephones HHA, Nursing Homes / Clarify orders / etc 0 []  - Routine Transfer to another Facility (non-emergent condition) 0 []  - Routine Hospital Admission (non-emergent condition) 0 []  - New Admissions / Biomedical engineer / Ordering NPWT, Apligraf, etc. 0 []  - Emergency Hospital Admission (emergent condition) 0 X - Simple Discharge Coordination 1 10 []  - Complex (extensive) Discharge Coordination 0 PROCESS - Special Needs []  - Pediatric / Minor Patient Management 0 []  - Isolation Patient Management 0 []  - Hearing / Language / Visual special needs 0 []  - Assessment of Community assistance (transportation, D/C planning, etc.) 0 []  - Additional assistance / Altered mentation 0 []  - Support Surface(s) Assessment (bed, cushion,  seat, etc.) 0 INTERVENTIONS - Wound Cleansing / Measurement X - Simple Wound Cleansing - one wound 1 5 []  - Complex Wound Cleansing - multiple wounds 0 X - Wound Imaging (photographs - any  number of wounds) 1 5 []  - Wound Tracing (instead of photographs) 0 X - Simple Wound Measurement - one wound 1 5 []  - Complex Wound Measurement - multiple wounds 0 INTERVENTIONS - Wound Dressings []  - Small Wound Dressing one or multiple wounds 0 []  - Medium Wound Dressing one or multiple wounds 0 []  - Large Wound Dressing one or multiple wounds 0 []  - Application of Medications - topical 0 []  - Application of Medications - injection 0 INTERVENTIONS - Miscellaneous []  - External ear exam 0 []  - Specimen Collection (cultures, biopsies, blood, body fluids, etc.) 0 []  - Specimen(s) / Culture(s) sent or taken to Lab for analysis 0 []  - Patient Transfer (multiple staff / / Similar devices) 0 []  - Simple Staple / Suture removal (25 or less) 0 []  - Complex Staple / Suture removal (26 or more) 0 []  - Hypo / Hyperglycemic Management (close monitor of Blood Glucose) 0 []  - Ankle / Brachial Index (ABI) - do not check if billed separately 0 X - Vital Signs 1 5 Has the patient been seen at the hospital within the last three years: Yes Total Score: 75 Level Of Care: New/Established - Level 2 Electronic Signature(s) Signed: 08/20/2019 5:51:44 PM By: RN, BSN Entered By: on 08/20/2019 17:39:20 -------------------------------------------------------------------------------- Encounter Discharge Information Details Patient Name: Date of Service: 08/20/2019 3:30 PM Medical Record Patient Account Number: Date of Birth/Sex: Treating RN: November 08, 1955 (62 y.o. Primary Care Latayvia Mandujano: Nurse, adult Other Clinician: Referring Glyn Zendejas: Treating Brannon Levene/Extender:Robson, , in Treatment:  4 Encounter Discharge Information Items Discharge Condition: Stable Ambulatory Status: Ambulatory Discharge Destination: Home Transportation: Private Auto Accompanied By: wife Schedule Follow-up Appointment: Yes Clinical Summary of Care: Patient Declined Electronic Signature(s) Signed: 08/20/2019 5:51:44 PM By: RN, BSN Entered By: 14/03/2019 on 08/20/2019 17:39:45 -------------------------------------------------------------------------------- Lower Extremity Assessment Details Patient Name: Date of Service: [name], [name] 08/20/2019 3:30 PM Medical Record [place] [facility] Patient Account Number: 14/03/2019 Date of Birth/Sex: Treating RN: 1956-06-01 (62 y.o. [id number] Primary Care Kaena Santori: 09/08/1956 Other Clinician: Referring Karron Goens: Treating Danelly Hassinger/Extender:Robson, Elizebeth Koller, [name] in Treatment: 4 Electronic Signature(s) Signed: 08/22/2019 12:04:56 PM By: Gardner Candle Entered By: 14/03/2019 on 08/20/2019 15:44:17 -------------------------------------------------------------------------------- Multi Wound Chart Details Patient Name: Date of Service: [name], [name] 08/20/2019 3:30 PM Medical Record [place] [facility] Patient Account Number: 14/03/2019 Date of Birth/Sex: Treating RN: 09/27/1955 (62 y.o. M) Primary Care Nayanna Seaborn: [id number] Other Clinician: Referring Jacarri Gesner: Treating Syrianna Schillaci/Extender:Robson, 09/08/1956, Katherina Right in Treatment: 4 Vital Signs Height(in): 66 Pulse(bpm): 115 Weight(lbs): 122 Blood Pressure(mmHg): 124/99 Body Mass Index(BMI): 20 Temperature(F): 98.2 Respiratory 18 Rate(breaths/min): Photos: [1:No Photos] [N/A:N/A] Wound Location: [1:Left Trochanter] [N/A:N/A] Wounding Event: [1:Gradually Appeared] [N/A:N/A] Primary Etiology: [1:Pressure Ulcer] [N/A:N/A] Comorbid History: [1:Hypertension, Dementia] [N/A:N/A] Date Acquired: [1:06/17/2019] [N/A:N/A] Weeks of  Treatment: [1:4] [N/A:N/A] Wound Status: [1:Healed - Epithelialized] [N/A:N/A] Measurements L x W x D [1:0x0x0] [N/A:N/A] (cm) Area (cm) : [1:0] [N/A:N/A] Volume (cm) : [1:0] [N/A:N/A] % Reduction in Area: [1:100.00%] [N/A:N/A] % Reduction in Volume: [1:100.00%] [N/A:N/A] Classification: [1:Category/Stage III] [N/A:N/A] Exudate Amount: [1:None Present] [N/A:N/A] Wound Margin: [1:Flat and Intact] [N/A:N/A] Granulation Amount: [1:None Present (0%)] [N/A:N/A] Necrotic Amount: [1:None Present (0%)] [N/A:N/A] Exposed Structures: [1:Fascia: No Fat Layer (Subcutaneous Tissue) Exposed: No Tendon: No Muscle: No Joint: No Bone: No Large (67-100%)] [N/A:N/A N/A] Treatment Notes Electronic Signature(s) Signed: 08/21/2019 10:01:17 AM By: Gardner Candle MD Entered By: 14/05/2019 on  08/20/2019 17:15:36 -------------------------------------------------------------------------------- Multi-Disciplinary Care Plan Details Patient Name: Date of Service: [place] Reeves, Marvin L. 08/20/2019 3:30 PM Medical Record [id number] Patient Account Number: [id number] Date of Birth/Sex: Treating RN: 1956-08-12 (63 y.o. Elizebeth KollerM) Lynch, Shatara Primary Care Reola Buckles: Mila PalmerWolters, Sharon Other Clinician: Referring Shahara Hartsfield: Treating Demere Dotzler/Extender:Robson, Era SkeenMichael Wolters, Gardner CandleSharon Weeks in Treatment: 4 Active Inactive Electronic Signature(s) Signed: 08/20/2019 5:51:44 PM By: Zandra AbtsLynch, Shatara RN, BSN Entered By: Zandra AbtsLynch, Shatara on 08/20/2019 17:38:40 -------------------------------------------------------------------------------- Pain Assessment Details Patient Name: Date of Service: [name], Marvin L. 08/20/2019 3:30 PM Medical Record [id number] Patient Account Number: [id number] Date of Birth/Sex: Treating RN: 1956-08-12 (62 y.o. Katherina RightM) Dwiggins, Shannon Primary Care Bari Handshoe: Mila PalmerWolters, Sharon Other Clinician: Referring Jolanda Mccann: Treating Khamani Fairley/Extender:Robson, Era SkeenMichael Wolters, Gardner CandleSharon Weeks in Treatment:  4 Active Problems Location of Pain Severity and Description of Pain Patient Has Paino No Site Locations Pain Management and Medication Current Pain Management: Electronic Signature(s) Signed: 08/22/2019 12:04:56 PM By: Cherylin Mylarwiggins, Shannon Entered By: Cherylin Mylarwiggins, Shannon on 08/20/2019 15:44:10 -------------------------------------------------------------------------------- Patient/Caregiver Education Details Patient Name: Date of Service: [name], Marvin L. 12/7/2020andnbsp3:30 PM Medical Record [id number] Patient Account Number: [id number] Date of Birth/Gender: 1956-08-12 (62 y.o. M) Treating RN: Zandra AbtsLynch, Shatara Primary Care Physician: Mila PalmerWolters, Sharon Other Clinician: Referring Physician: Treating Physician/Extender:Robson, Era SkeenMichael Wolters, Gardner CandleSharon Weeks in Treatment: 4 Education Assessment Education Provided To: Patient Education Topics Provided Wound/Skin Impairment: Methods: Explain/Verbal Responses: State content correctly Electronic Signature(s) Signed: 08/20/2019 5:51:44 PM By: Zandra AbtsLynch, Shatara RN, BSN Entered By: Zandra AbtsLynch, Shatara on 08/20/2019 17:38:50 -------------------------------------------------------------------------------- Wound Assessment Details Patient Name: Date of Service: [name], Marvin L. 08/20/2019 3:30 PM Medical Record [id number] Patient Account Number: [id number] Date of Birth/Sex: Treating RN: 1956-08-12 (62 y.o. Katherina RightM) Dwiggins, Shannon Primary Care Rhylin Venters: Mila PalmerWolters, Sharon Other Clinician: Referring Ernestyne Caldwell: Treating Wileen Duncanson/Extender:Robson, Era SkeenMichael Wolters, Gardner CandleSharon Weeks in Treatment: 4 Wound Status Wound Number: 1 Primary Etiology: Pressure Ulcer Wound Location: Left Trochanter Wound Status: Healed - Epithelialized Wounding Event: Gradually Appeared Comorbid History: Hypertension, Dementia Date Acquired: 06/17/2019 Weeks Of Treatment: 4 Clustered Wound: No Photos Wound Measurements Length: (cm) 0 % Reductio Width: (cm) 0 %  Reductio Depth: (cm) 0 Epithelial Area: (cm) 0 Tunneling Volume: (cm) 0 Undermini Wound Description Classification: Category/Stage III Foul Odor Wound Margin: Flat and Intact Slough/Fib Exudate Amount: None Present Wound Bed Granulation Amount: None Present (0%) Necrotic Amount: None Present (0%) Fascia Exp Fat Layer Tendon Exp Muscle Exp Joint Expo Bone Expos After Cleansing: No rino No Exposed Structure osed: No (Subcutaneous Tissue) Exposed: No osed: No osed: No sed: No ed: No n in Area: 100% n in Volume: 100% ization: Large (67-100%) : No ng: No Electronic Signature(s) Signed: 08/21/2019 4:34:42 PM By: Benjaman KindlerJones, Dedrick EMT/HBOT Signed: 08/22/2019 12:04:56 PM By: Cherylin Mylarwiggins, Shannon Previous Signature: 08/20/2019 5:51:44 PM Version By: Zandra AbtsLynch, Shatara RN, BSN Entered By: Benjaman KindlerJones, Dedrick on 08/21/2019 14:00:46 -------------------------------------------------------------------------------- Vitals Details Patient Name: Date of Service: [name], Marvin L. 08/20/2019 3:30 PM Medical Record [id number] Patient Account Number: [id number] Date of Birth/Sex: Treating RN: 1956-08-12 (62 y.o. Katherina RightM) Dwiggins, Shannon Primary Care Yesika Rispoli: Mila PalmerWolters, Sharon Other Clinician: Referring Sylvania Moss: Treating Jatin Naumann/Extender:Robson, Era SkeenMichael Wolters, Gardner CandleSharon Weeks in Treatment: 4 Vital Signs Time Taken: 15:40 Temperature (F): 98.2 Height (in): 66 Pulse (bpm): 115 Weight (lbs): 122 Respiratory Rate (breaths/min): 18 Body Mass Index (BMI): 19.7 Blood Pressure (mmHg): 124/99 Reference Range: 80 - 120 mg / dl Electronic Signature(s) Signed: 08/22/2019 12:04:56 PM By: Cherylin Mylarwiggins, Shannon Entered By: Cherylin Mylarwiggins, Shannon on 08/20/2019 15:44:04

## 2019-09-04 ENCOUNTER — Other Ambulatory Visit: Payer: Self-pay | Admitting: Psychiatry

## 2019-12-06 ENCOUNTER — Other Ambulatory Visit: Payer: Self-pay | Admitting: Psychiatry

## 2020-02-04 ENCOUNTER — Encounter (HOSPITAL_COMMUNITY): Payer: Self-pay

## 2020-02-04 ENCOUNTER — Emergency Department (HOSPITAL_COMMUNITY)
Admission: EM | Admit: 2020-02-04 | Discharge: 2020-02-05 | Disposition: A | Discharge status: Skilled Nursing Facility | Payer: Medicare Other | Attending: Emergency Medicine | Admitting: Emergency Medicine

## 2020-02-04 ENCOUNTER — Emergency Department (HOSPITAL_COMMUNITY): Payer: Medicare Other

## 2020-02-04 ENCOUNTER — Other Ambulatory Visit: Payer: Self-pay

## 2020-02-04 DIAGNOSIS — G300 Alzheimer's disease with early onset: Secondary | ICD-10-CM | POA: Diagnosis not present

## 2020-02-04 DIAGNOSIS — R2681 Unsteadiness on feet: Secondary | ICD-10-CM | POA: Diagnosis not present

## 2020-02-04 DIAGNOSIS — F419 Anxiety disorder, unspecified: Secondary | ICD-10-CM | POA: Diagnosis not present

## 2020-02-04 DIAGNOSIS — Z9181 History of falling: Secondary | ICD-10-CM | POA: Diagnosis not present

## 2020-02-04 DIAGNOSIS — I10 Essential (primary) hypertension: Secondary | ICD-10-CM | POA: Diagnosis not present

## 2020-02-04 DIAGNOSIS — Z8659 Personal history of other mental and behavioral disorders: Secondary | ICD-10-CM

## 2020-02-04 DIAGNOSIS — Z79899 Other long term (current) drug therapy: Secondary | ICD-10-CM | POA: Diagnosis not present

## 2020-02-04 DIAGNOSIS — R296 Repeated falls: Secondary | ICD-10-CM | POA: Insufficient documentation

## 2020-02-04 DIAGNOSIS — R410 Disorientation, unspecified: Secondary | ICD-10-CM | POA: Diagnosis present

## 2020-02-04 DIAGNOSIS — R4182 Altered mental status, unspecified: Secondary | ICD-10-CM

## 2020-02-04 HISTORY — DX: Dementia in other diseases classified elsewhere, unspecified severity, without behavioral disturbance, psychotic disturbance, mood disturbance, and anxiety: F02.80

## 2020-02-04 LAB — CBC WITH DIFFERENTIAL/PLATELET
Abs Immature Granulocytes: 0.03 10*3/uL (ref 0.00–0.07)
Basophils Absolute: 0.1 10*3/uL (ref 0.0–0.1)
Basophils Relative: 1 %
Eosinophils Absolute: 0.1 10*3/uL (ref 0.0–0.5)
Eosinophils Relative: 1 %
HCT: 41.1 % (ref 39.0–52.0)
Hemoglobin: 13.3 g/dL (ref 13.0–17.0)
Immature Granulocytes: 0 %
Lymphocytes Relative: 13 %
Lymphs Abs: 1.2 10*3/uL (ref 0.7–4.0)
MCH: 30.5 pg (ref 26.0–34.0)
MCHC: 32.4 g/dL (ref 30.0–36.0)
MCV: 94.3 fL (ref 80.0–100.0)
Monocytes Absolute: 1 10*3/uL (ref 0.1–1.0)
Monocytes Relative: 10 %
Neutro Abs: 6.9 10*3/uL (ref 1.7–7.7)
Neutrophils Relative %: 75 %
Platelets: 264 10*3/uL (ref 150–400)
RBC: 4.36 MIL/uL (ref 4.22–5.81)
RDW: 12.5 % (ref 11.5–15.5)
WBC: 9.3 10*3/uL (ref 4.0–10.5)
nRBC: 0 % (ref 0.0–0.2)

## 2020-02-04 LAB — URINALYSIS, ROUTINE W REFLEX MICROSCOPIC
Bilirubin Urine: NEGATIVE
Glucose, UA: NEGATIVE mg/dL
Hgb urine dipstick: NEGATIVE
Ketones, ur: NEGATIVE mg/dL
Leukocytes,Ua: NEGATIVE
Nitrite: NEGATIVE
Protein, ur: NEGATIVE mg/dL
Specific Gravity, Urine: 1.012 (ref 1.005–1.030)
pH: 7 (ref 5.0–8.0)

## 2020-02-04 LAB — BASIC METABOLIC PANEL
Anion gap: 8 (ref 5–15)
BUN: 13 mg/dL (ref 8–23)
CO2: 29 mmol/L (ref 22–32)
Calcium: 9.2 mg/dL (ref 8.9–10.3)
Chloride: 103 mmol/L (ref 98–111)
Creatinine, Ser: 0.71 mg/dL (ref 0.61–1.24)
GFR calc Af Amer: >60 mL/min (ref >60)
GFR calc non Af Amer: >60 mL/min (ref >60)
Glucose, Bld: 107 mg/dL — ABNORMAL HIGH (ref 70–99)
Potassium: 4.1 mmol/L (ref 3.5–5.1)
Sodium: 140 mmol/L (ref 135–145)

## 2020-02-04 MED ORDER — VITAMIN B-12 1000 MCG PO TABS
1000.0000 ug | ORAL_TABLET | Freq: Every day | ORAL | Status: DC
  Administered 2020-02-04 – 2020-02-05 (×2): 1000 ug via ORAL
  Filled 2020-02-04 (×2): qty 1

## 2020-02-04 MED ORDER — NEBIVOLOL HCL 5 MG PO TABS
5.0000 mg | ORAL_TABLET | Freq: Every day | ORAL | Status: DC
  Administered 2020-02-05: 5 mg via ORAL
  Filled 2020-02-04 (×2): qty 1

## 2020-02-04 MED ORDER — MEMANTINE HCL 10 MG PO TABS
10.0000 mg | ORAL_TABLET | Freq: Two times a day (BID) | ORAL | Status: DC
  Administered 2020-02-04 – 2020-02-05 (×2): 10 mg via ORAL
  Filled 2020-02-04 (×3): qty 1

## 2020-02-04 MED ORDER — MIRTAZAPINE 7.5 MG PO TABS
15.0000 mg | ORAL_TABLET | Freq: Every day | ORAL | Status: DC
  Administered 2020-02-04: 15 mg via ORAL
  Filled 2020-02-04: qty 2

## 2020-02-04 MED ORDER — RISPERIDONE 0.5 MG PO TABS
0.5000 mg | ORAL_TABLET | Freq: Two times a day (BID) | ORAL | Status: DC
  Administered 2020-02-05: 0.5 mg via ORAL
  Filled 2020-02-04: qty 1

## 2020-02-04 MED ORDER — RISPERIDONE 0.5 MG PO TABS: 0.5000 mg | ORAL_TABLET | Freq: Two times a day (BID) | ORAL | Status: DC

## 2020-02-04 NOTE — ED Provider Notes (Signed)
[facility] DEPT Provider Note   CSN: [id number] Arrival date & time: 02/04/20  1147     History Chief Complaint  Patient presents with  . Fall  . alzheimer's worse    [name] is a 64 y.o. male.  Patient is a 64 year old male with past medical history of early onset dementia, anxiety, hypertension, hyperlipidemia.  He is referred here by his neurology team at [facility] for evaluation of increased confusion.  Patient has been less steady on his feet and more agitated since medication adjustments approximately 3 weeks ago.  His risperidone was cut back from twice daily to once daily, then stopped completely.  He was then started on mirtazapine.  Since then, he has been more agitated and difficult to care for and has fallen twice, once striking his head, and another his right flank.  Patient was told to come here for placement.    The patient's wife is his primary caretaker.  He goes to a memory care center during the day while she is at work as a Licensed conveyancer and she takes care of him during the evening.  The history is provided by the patient and the spouse.       Past Medical History:  Diagnosis Date  . Alzheimer's dementia ([place])   . Anxiety   . Hypercholesterolemia   . Hypertension     Patient Active Problem List   Diagnosis Date Noted  . 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 GAD (generalized anxiety disorder) 06/22/2018    History reviewed. No pertinent surgical history.     History reviewed. No pertinent family history.  Social History   Tobacco Use  . Smoking status: Never Smoker  . Smokeless tobacco: Never Used  Substance Use Topics  . Alcohol use: No  . Drug use: No    Home Medications Prior to Admission medications   Medication Sig Start Date End Date Taking? Authorizing Provider  BYSTOLIC 5 MG tablet TAKE 1 TABLET BY MOUTH EVERY DAY  12/06/19   Cottle, Billey Co., MD  memantine (NAMENDA) 10 MG tablet Take 10 mg by mouth 2 (two) times daily.    [provider]  mirtazapine (REMERON) 7.5 MG tablet Take 7.5 mg by mouth at bedtime. 05/30/19   [provider]  risperiDONE (RISPERDAL) 0.5 MG tablet Take 1 tablet (0.5 mg total) by mouth every morning. Patient taking differently: Take 0.5 mg by mouth 2 (two) times daily.  08/14/18   Cottle, Billey Co., MD  vitamin B-12 (CYANOCOBALAMIN) 1000 MCG tablet Take 1,000 mcg by mouth daily.    [provider]    Allergies    Patient has no known allergies.  Review of Systems   Review of Systems  All other systems reviewed and are negative.   Physical Exam Updated Vital Signs BP (!) 147/79 (BP Location: Right Arm)   Pulse 82   Temp 98.5 F (36.9 C) (Oral)   Resp 16   Ht 5\' 6"  (1.676 m)   Wt 59.9 kg   SpO2 100%   BMI 21.31 kg/m   Physical Exam Vitals and nursing note reviewed.  Constitutional:      General: He is not in acute distress.    Appearance: He is well-developed. He is not diaphoretic.  HENT:     Head: Normocephalic and atraumatic.  Cardiovascular:     Rate and Rhythm: Normal rate and regular rhythm.  Heart sounds: No murmur. No friction rub.  Pulmonary:     Effort: Pulmonary effort is normal. No respiratory distress.     Breath sounds: Normal breath sounds. No wheezing or rales.  Abdominal:     General: Bowel sounds are normal. There is no distension.     Palpations: Abdomen is soft.     Tenderness: There is no abdominal tenderness.  Musculoskeletal:        General: Normal range of motion.     Cervical back: Normal range of motion and neck supple.  Skin:    General: Skin is warm and dry.  Neurological:     Mental Status: He is alert.     Comments: Patient is awake, alert, and active.  He is ambulatory in the exam room.  He moves all extremities with purpose.  He appears somewhat rigid and tremulous.  Speech with repetitive  statements about "football" and "going to [employer]".  Remainder of neurologic exam is difficult secondary to baseline mental status.     ED Results / Procedures / Treatments   Labs (all labs ordered are listed, but only abnormal results are displayed) Labs Reviewed  BASIC METABOLIC PANEL  CBC WITH DIFFERENTIAL/PLATELET  URINALYSIS, ROUTINE W REFLEX MICROSCOPIC    EKG None  Radiology No results found.  Procedures Procedures (including critical care time)  Medications Ordered in ED Medications - No data to display  ED Course  I have reviewed the triage vital signs and the nursing notes.  Pertinent labs & imaging results that were available during my care of the patient were reviewed by me and considered in my medical decision making (see chart for details).    MDM Rules/Calculators/A&P  Patient brought here by wife for evaluation of mental status change.  Patient with history of dementia.  He has had recent medication changes that have apparently resulted in his behavior changing and he has become less stable on his feet.  He has fallen several times.  Patient goes to a memory care center during the day while his wife is at work and the memory care center does not feel as though they can safely care for him.  Wife unable to take care of him at home as she is employed and works during the day.  I have found nothing thus far in his medical work-up to explain his change in mental status or instability.  His CBC and metabolic profile are unremarkable and urinalysis is clear.  He is displaying no focal deficits that would suggest stroke.  Patient to undergo CT scan to further evaluate for a cause of his instability and injury related to one of his falls.  Social services has evaluated the patient and are seeing about a possible short-term placement.  Disposition pending results of this.  Care will be signed out to Dr. Dalene Seltzer at shift change.  She will obtain the results of the CT scan  and assist in determining the final disposition.  Final Clinical Impression(s) / ED Diagnoses Final diagnoses:  None    Rx / DC Orders ED Discharge Orders    None       Geoffery Lyons, MD 02/04/20 [phone]

## 2020-02-04 NOTE — ED Triage Notes (Signed)
patient has Alzheimer's. Patient's wife states that he had medication changes on 01/14/20. Patient has had multiple falls since and has hit his head.  Patient has been going to the [facility], but states that they can no longer care for him due to increased falls and memory changes.

## 2020-02-04 NOTE — TOC Initial Note (Addendum)
Transition of Care [facility]) - Initial/Assessment Note    Patient Details  Name: [name] MRN: [id number] Date of Birth: 03/24/1956  [facility] CM/SW Contact:    Marvin Cousin, RN Phone Number: [phone] 02/04/2020, 3:24 PM  Clinical Narrative:                 TOC CM spoke to pt and wife at bedside. Pt unable to answer questions. Wife states pt was at [facility]. States they are unable to accept him back due to falls. Wife states she works during the day. She is interesting in pt going to SNF rehab. TOC CM explained SNF rehab, ALF/Memory Care and respite care. Wife states she would like SNF rehab and she would eventually want him placed in long term to Memory Care. Gave permission to create FL2 and fax referral to SNF facilities for rehab.   Provided list of for ALF/Memory Care to wife to research.    Expected Discharge Plan: Skilled Nursing Facility Barriers to Discharge: Continued Medical Work up   Patient Goals and CMS Choice Patient states their goals for this hospitalization and ongoing recovery are:: wants pt to have rehab to assist him with walking, he is falling CMS Medicare.gov Compare Post Acute Care list provided to:: Patient Represenative (must comment)(wife-[name]) Choice offered to / list presented to : Spouse  Expected Discharge Plan and Services Expected Discharge Plan: Skilled Nursing Facility In-house Referral: Clinical Social Work Discharge Planning Services: CM Consult Post Acute Care Choice: Skilled Nursing Facility Living arrangements for the past 2 months: Single Family Home                                      Prior Living Arrangements/Services Living arrangements for the past 2 months: Single Family Home Lives with:: Spouse Patient language and need for interpreter reviewed:: Yes Do you feel safe going back to the place where you live?: No   falling  Need for Family Participation in  Patient Care: Yes (Comment) Care giver support system in place?: Yes (comment)   Criminal Activity/Legal Involvement Pertinent to Current Situation/Hospitalization: No - Comment as needed  Activities of Daily Living      Permission Sought/Granted Permission sought to share information with : Case Manager, PCP, Family Supports Permission granted to share information with : Yes, Verbal Permission Granted  Share Information with NAME: [name]  Permission granted to share info w AGENCY: SNF  Permission granted to share info w Relationship: wife  Permission granted to share info w Contact Information: [phone]  Emotional Assessment Appearance:: Appears stated age Attitude/Demeanor/Rapport: Other (comment)(cooperative) Affect (typically observed): Calm Orientation: : (not oriented)   Psych Involvement: No (comment)  Admission diagnosis:  Fall; Needs Placement  Patient Active Problem List   Diagnosis Date Noted  . 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 GAD (generalized anxiety disorder) 06/22/2018   PCP:  Marvin Palmer, MD Pharmacy:   [facility] [phone] - [place], [place] - [address] AT [address] [place] [address] Phone: [phone] Fax: [phone]     Social Determinants of Health (SDOH) Interventions    Readmission Risk Interventions No flowsheet data found.

## 2020-02-04 NOTE — [place] FL2 (Signed)
  [place] MEDICAID FL2 LEVEL OF CARE SCREENING TOOL     IDENTIFICATION  Patient Name: [name] Birthdate: March 09, 1956 Sex: male Admission Date (Current Location): 02/04/2020  [facility] and [place] Number:  Producer, television/film/video and Address:  [facility],  [address] [place] [address]      Provider Number: [id number]  Attending Physician Name and Address:  Alvira Monday, MD  Relative Name and Phone Number:  [place] Cloe wife, #[phone]    Current Level of Care: SNF Recommended Level of Care: Skilled Nursing Facility Prior Approval Number:    Date Approved/Denied:   PASRR Number: [id number]  Discharge Plan: SNF    Current Diagnoses: Patient Active Problem List   Diagnosis Date Noted  . 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 GAD (generalized anxiety disorder) 06/22/2018    Orientation RESPIRATION BLADDER Height & Weight     Self  Normal Incontinent Weight: 59.9 kg Height:  5\' 6"  (167.6 cm)  BEHAVIORAL SYMPTOMS/MOOD NEUROLOGICAL BOWEL NUTRITION STATUS      Incontinent Diet(regular)  AMBULATORY STATUS COMMUNICATION OF NEEDS Skin   Extensive Assist Verbally Normal                       Personal Care Assistance Level of Assistance  Bathing, Dressing, Feeding Bathing Assistance: Maximum assistance Feeding assistance: Limited assistance Dressing Assistance: Limited assistance     Functional Limitations Info  Sight, Speech, Hearing Sight Info: Adequate Hearing Info: Adequate Speech Info: Impaired    SPECIAL CARE FACTORS FREQUENCY  PT (By licensed PT), OT (By licensed OT)     PT Frequency: 5x OT Frequency: 5x            Contractures      Additional Factors Info  Code Status, Allergies Code Status Info: Full Allergies Info: NKA           Current Medications (02/04/2020):  This is the current hospital active medication list No  current facility-administered medications for this encounter.   Current Outpatient Medications  Medication Sig Dispense Refill  . BYSTOLIC 5 MG tablet TAKE 1 TABLET BY MOUTH EVERY DAY (Patient taking differently: Take 5 mg by mouth daily. ) 90 tablet 0  . memantine (NAMENDA) 10 MG tablet Take 10 mg by mouth 2 (two) times daily.    . mirtazapine (REMERON) 15 MG tablet Take 15 mg by mouth at bedtime.    . vitamin B-12 (CYANOCOBALAMIN) 1000 MCG tablet Take 1,000 mcg by mouth daily.    . risperiDONE (RISPERDAL) 0.5 MG tablet Take 1 tablet (0.5 mg total) by mouth every morning. (Patient taking differently: Take 0.5 mg by mouth 2 (two) times daily. ) 90 tablet 1     Discharge Medications: Please see discharge summary for a list of discharge medications.  Relevant Imaging Results:  Relevant Lab Results:   Additional Information 02/06/2020  IB#[id number], RN

## 2020-02-04 NOTE — Evaluation (Signed)
Physical Therapy Evaluation Patient Details Name: [name] MRN: [id number] DOB: [birth year] Today's Date: 02/04/2020   History of Present Illness  Patient is a 64 year old male with past medical history of early onset dementia, anxiety, hypertension, hyperlipidemia.  He is referred here by his neurology team at [facility] for evaluation of increased confusion.  Patient has been less steady on his feet and more agitated since medication adjustments approximately 3 weeks ago. Rib fractures  on Left 8.9,10.  Clinical Impression  The patient was pleasant , does not follow any simple directions for mobility.  Made an attempt to sit on bed edge, patient with posterior lean and pushing and unable to sit upright. So returned to supine. Patient's wife present, reports patient ambulatory until recently . Patient attends memory care day center. Currently patient is far from baseline. Hopefully will be  Able to mobilize to standing next visit with less resistive posterior bias. .Pt admitted with above diagnosis. . Pt currently with functional limitations due to the deficits listed below (see PT Problem List). Pt will benefit from skilled PT to increase their independence and safety with mobility to allow discharge to the venue listed below.       Follow Up Recommendations SNF;Supervision/Assistance - 24 hour    Equipment Recommendations  None recommended by PT    Recommendations for Other Services       Precautions / Restrictions Precautions Precautions: Fall      Mobility  Bed Mobility Overal bed mobility: Needs Assistance Bed Mobility: Rolling;Supine to Sit;Sit to Supine Rolling: Max assist;+2 for physical assistance;+2 for safety/equipment   Supine to sit: Max assist;+2 for physical assistance;+2 for safety/equipment Sit to supine: +2 for safety/equipment;+2 for physical assistance;HOB elevated;Total assist   General bed mobility comments: assisted  both legs over bed edge, assisted with  trunk to sit upright but patient pushing into posterior lean and  unable to fully sit upright., Assisted trunk back to supine and placed legs onto bed.  Transfers                 General transfer comment: unable to attempt due to posterior bias and not able to sit upright.  Ambulation/Gait                Stairs            Wheelchair Mobility    Modified Rankin (Stroke Patients Only)       Balance Overall balance assessment: Needs assistance;History of Falls Sitting-balance support: No upper extremity supported;Feet unsupported Sitting balance-Leahy Scale: Zero Sitting balance - Comments: tends to push back Postural control: Posterior lean                                   Pertinent Vitals/Pain Pain Assessment: No/denies pain    Home Living Family/patient expects to be discharged to:: Private residence Living Arrangements: Spouse/significant other Available Help at Discharge: Family Type of Home: House       Home Layout: One level Home Equipment: None Additional Comments: goes to adult [facility]  during day when wife works. Brother assists a little.    Prior Function Level of Independence: Needs assistance   Gait / Transfers Assistance Needed: per wife, was independent to ambulate, not continent-wears pull ups  ADL's / Homemaking Assistance Needed: feeds self,wife states appetute is good        Hand Dominance        Extremity/Trunk  Assessment   Upper Extremity Assessment Upper Extremity Assessment: RUE deficits/detail;LUE deficits/detail RUE Deficits / Details: keeps elbows mostly flexed and tense., grips rail strongly LUE Deficits / Details: same as  right    Lower Extremity Assessment Lower Extremity Assessment: RLE deficits/detail;LLE deficits/detail RLE Deficits / Details: flexes and extends and crosses legs when in supine. LLE Deficits / Details: same as right       Communication      Cognition  Arousal/Alertness: Awake/alert Behavior During Therapy: Anxious Overall Cognitive Status: History of cognitive impairments - at baseline                                 General Comments: does not follow commands, at times resists but did allow rolling.      General Comments      Exercises     Assessment/Plan    PT Assessment Patient needs continued PT services  PT Problem List Decreased strength;Decreased balance;Decreased cognition;Decreased knowledge of precautions;Decreased mobility;Decreased knowledge of use of DME;Decreased activity tolerance;Decreased safety awareness       PT Treatment Interventions Gait training;Functional mobility training;Therapeutic activities;Cognitive remediation;Patient/family education    PT Goals (Current goals can be found in the Care Plan section)  Acute Rehab PT Goals Patient Stated Goal: per wife, to be able to walk PT Goal Formulation: With family Time For Goal Achievement: 02/18/20 Potential to Achieve Goals: Fair    Frequency Min 2X/week   Barriers to discharge Decreased caregiver support      Co-evaluation               AM-PAC PT "6 Clicks" Mobility  Outcome Measure Help needed turning from your back to your side while in a flat bed without using bedrails?: Total Help needed moving from lying on your back to sitting on the side of a flat bed without using bedrails?: Total Help needed moving to and from a bed to a chair (including a wheelchair)?: Total Help needed standing up from a chair using your arms (e.g., wheelchair or bedside chair)?: Total Help needed to walk in hospital room?: Total Help needed climbing 3-5 steps with a railing? : Total 6 Click Score: 6    End of Session   Activity Tolerance: Patient tolerated treatment well Patient left: in bed;with call bell/phone within reach;with family/visitor present Nurse Communication: Mobility status PT Visit Diagnosis: Unsteadiness on feet  (R26.81);History of falling (Z91.81);Repeated falls (R29.6)    Time: 4656-8127 PT Time Calculation (min) (ACUTE ONLY): 33 min   Charges:   PT Evaluation $PT Eval Moderate Complexity: 1 Mod PT Treatments $Therapeutic Activity: 8-22 mins        Blanchard Kelch PT Acute Rehabilitation Services Pager [phone] Office [phone]   Rada Hay 02/04/2020, 4:49 PM

## 2020-02-04 NOTE — ED Notes (Signed)
Spoke with CT regarding pending scan. CT states they will come get the pt shortly.

## 2020-02-04 NOTE — ED Provider Notes (Signed)
  Physical Exam  BP (!) 143/82   Pulse 69   Temp 98.5 F (36.9 C) (Oral)   Resp 18   Ht 5\' 6"  (1.676 m)   Wt 59.9 kg   SpO2 99%   BMI 21.31 kg/m   Physical Exam  ED Course/Procedures     Procedures  MDM  Received care of patient from Dr. at 4 PM.  Briefly this is a 64 year old male with history of early onset dementia who presented with concern for behavioral changes and becoming less stable on his feet.  CT head without signs of acute abnormalities.  No focal deficits to suggest CVA.  No sign of acute infection.  He has had progressive change in his gait and mental status, wife is unable to care for him at home, and memory care will not accept him back given his decline.  Given this, he is appropriate for skilled nursing facility.  Social work and case management have been consulted. He is awaiting placement.      77, MD 02/05/20 [phone]

## 2020-02-04 NOTE — ED Notes (Signed)
PT called by Beather Arbour

## 2020-02-05 NOTE — Progress Notes (Signed)
[facility] currently reviewing patient referral information.  Sophire Sherryle Lis LCSWA Transitions of Care  Clinical Social Worker  Ph: [phone]

## 2020-02-05 NOTE — TOC Progression Note (Signed)
Transition of Care [facility]) - Progression Note    Patient Details  Name: RAYNALDO FALCO MRN: [id number] Date of Birth: 1956-06-06  Transition of Care [facility]) CM/SW Contact  Sindi Beckworth Sherryle Lis, LCSW Phone Number: 02/05/2020, 11:08 AM  Clinical Narrative:   CSW received call back from patients spouse [name] who explains that they would like for patient to discharge to [facility] for Long Term Placement.   CSW later in contact with Tanya with admissions from [facility] to confirm bed referral.CSW facilitated 3 way phone call with Kenney Houseman and patients spouse to answer questions about patients past placement. [name] reports that patient is no longer suitable for suitable for adult memory care daycare at [facility]. [name] goes into detail and states that patient spent many years in a locked unit and requires a higher level of care and supervision to prevent falls. Wife reports that she has enough funds to last a few months for LTC care private pay and then can anticipate applying for LTC medicaid.   [facility] Houseman explains to CSW that they do not currently have a LTC bed available and would probably not be able to meet patients needs. Kenney Houseman states that she is going to consult with the DON concerning patient acceptance.   TOC team will continue to follow patient for placement needs  Tinsley Lomas Tomma Rakers Transitions of Care  Clinical Social Worker  Ph: [phone]    Expected Discharge Plan: Skilled Nursing Facility Barriers to Discharge: Continued Medical Work up  Expected Discharge Plan and Services Expected Discharge Plan: Skilled Nursing Facility In-house Referral: Clinical Social Work Discharge Planning Services: CM Consult Post Acute Care Choice: Skilled Nursing Facility Living arrangements for the past 2 months: Single Family Home                                       Social Determinants of Health (SDOH) Interventions    Readmission Risk Interventions No flowsheet data found.

## 2020-02-05 NOTE — TOC Progression Note (Signed)
Transition of Care [facility]) - Progression Note    Patient Details  Name: [name] MRN: [id number] Date of Birth: 09-19-1955  Transition of Care [facility]) CM/SW Contact  Pailyn Bellevue Sherryle Lis, LCSW Phone Number: 02/05/2020, 10:42 AM  Clinical Narrative:   CSW in contact with patients spouse Alroy Portela [facility]: [phone] regarding patient placement options. CSW informed patient of the facilities that have accepted patient through the hub. Wife explains that she would like to consult with her sister who is a Engineer, civil (consulting) before making a decision. [name] states that she will contact CSW back as soon as possible as she understands the urgency in him discharging from the ED.    TOC team will continue to follow patient for placement and discharge related needs.  Geofrey Silliman Sherryle Lis LCSWA Transitions of Care  Clinical Social Worker  Ph: [phone]    Expected Discharge Plan: Skilled Nursing Facility Barriers to Discharge: Continued Medical Work up  Expected Discharge Plan and Services Expected Discharge Plan: Skilled Nursing Facility In-house Referral: Clinical Social Work Discharge Planning Services: CM Consult Post Acute Care Choice: Skilled Nursing Facility Living arrangements for the past 2 months: Single Family Home                                       Social Determinants of Health (SDOH) Interventions    Readmission Risk Interventions No flowsheet data found.

## 2020-02-05 NOTE — TOC Transition Note (Signed)
Transition of Care [facility]) - CM/SW Discharge Note   Patient Details  Name: [name] MRN: [id number] Date of Birth: 06-03-1956  Transition of Care [facility]) CM/SW Contact:  Burnie Hank Sherryle Lis, LCSW Phone Number: 02/05/2020, 2:33 PM   Clinical Narrative:   Patient discharging to [facility]. Family has been contacted and is aware of transition. CSW has arranged for transportation via [facility] non emergency transportation.   CSW awaiting call back from [place] with information for report.   Diem Pagnotta Sherryle Lis LCSWA Transitions of Care  Clinical Social Worker  Ph: [phone]      Barriers to Discharge: Continued Medical Work up   Patient Goals and CMS Choice Patient states their goals for this hospitalization and ongoing recovery are:: wants pt to have rehab to assist him with walking, he is falling CMS Medicare.gov Compare Post Acute Care list provided to:: Patient Represenative (must comment)(wife-[name] Katrinka Blazing) Choice offered to / list presented to : Spouse  Discharge Placement                       Discharge Plan and Services In-house Referral: Clinical Social Work Discharge Planning Services: CM Consult Post Acute Care Choice: Skilled Nursing Facility                               Social Determinants of Health (SDOH) Interventions     Readmission Risk Interventions No flowsheet data found.

## 2020-02-05 NOTE — Progress Notes (Signed)
[facility] at bedside for transport to [facility]. Nurse to nurse given to [place] at [phone]. Notified wife [name] of transport. No distress noted.

## 2020-02-06 ENCOUNTER — Telehealth: Payer: Self-pay | Admitting: Psychiatry

## 2020-02-06 NOTE — Telephone Encounter (Signed)
Noted! Thank you

## 2020-02-06 NOTE — Telephone Encounter (Signed)
Pt wife left message stating she had to put him in a health care facility. He keep falling and he is now @ [facility]. Cancel all apts. He will not be coming back.

## 2020-02-13 ENCOUNTER — Ambulatory Visit: Payer: Medicare Other | Admitting: Psychiatry

## 2020-03-02 ENCOUNTER — Other Ambulatory Visit: Payer: Self-pay | Admitting: Psychiatry

## 2020-09-04 ENCOUNTER — Emergency Department (HOSPITAL_COMMUNITY)
Admission: EM | Admit: 2020-09-04 | Discharge: 2020-09-04 | Disposition: A | Discharge status: Home / Self Care | Payer: Medicare Other | Attending: Emergency Medicine | Admitting: Emergency Medicine

## 2020-09-04 ENCOUNTER — Encounter (HOSPITAL_COMMUNITY): Payer: Self-pay

## 2020-09-04 ENCOUNTER — Other Ambulatory Visit: Payer: Self-pay

## 2020-09-04 DIAGNOSIS — Y9301 Activity, walking, marching and hiking: Secondary | ICD-10-CM | POA: Diagnosis not present

## 2020-09-04 DIAGNOSIS — Z23 Encounter for immunization: Secondary | ICD-10-CM | POA: Insufficient documentation

## 2020-09-04 DIAGNOSIS — S0083XA Contusion of other part of head, initial encounter: Secondary | ICD-10-CM | POA: Insufficient documentation

## 2020-09-04 DIAGNOSIS — S0990XA Unspecified injury of head, initial encounter: Secondary | ICD-10-CM | POA: Diagnosis present

## 2020-09-04 DIAGNOSIS — W01198A Fall on same level from slipping, tripping and stumbling with subsequent striking against other object, initial encounter: Secondary | ICD-10-CM | POA: Diagnosis not present

## 2020-09-04 DIAGNOSIS — I10 Essential (primary) hypertension: Secondary | ICD-10-CM | POA: Diagnosis not present

## 2020-09-04 MED ORDER — TETANUS-DIPHTH-ACELL PERTUSSIS 5-2.5-18.5 LF-MCG/0.5 IM SUSY
0.5000 mL | PREFILLED_SYRINGE | Freq: Once | INTRAMUSCULAR | Status: AC
  Administered 2020-09-04: 0.5 mL via INTRAMUSCULAR
  Filled 2020-09-04: qty 0.5

## 2020-09-04 NOTE — ED Provider Notes (Signed)
[facility]-EMERGENCY DEPT Provider Note   CSN: [id number] Arrival date & time: 09/04/20  1305     History Chief Complaint  Patient presents with  . Fall    [name] is a 64 y.o. male.  Patient presents from a dementia unit with complaint of a fall today per staff he lost his balance and fell and hit his head against a wall.  Injury occurred earlier today.  Complete history review of systems unable to be obtained from the patient himself as he has history of severe dementia and is not verbal.        Past Medical History:  Diagnosis Date  . Alzheimer's dementia (HCC)   . Anxiety   . Hypercholesterolemia   . Hypertension     Patient Active Problem List   Diagnosis Date Noted  . 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 GAD (generalized anxiety disorder) 06/22/2018    History reviewed. No pertinent surgical history.     History reviewed. No pertinent family history.  Social History   Tobacco Use  . Smoking status: Never Smoker  . Smokeless tobacco: Never Used  Vaping Use  . Vaping Use: Never used  Substance Use Topics  . Alcohol use: No  . Drug use: No    Home Medications Prior to Admission medications   Medication Sig Start Date End Date Taking? Authorizing Provider  BYSTOLIC 5 MG tablet TAKE 1 TABLET BY MOUTH EVERY DAY Patient taking differently: Take 5 mg by mouth daily.  12/06/19   Cottle, Steva Ready., MD  memantine (NAMENDA) 10 MG tablet Take 10 mg by mouth 2 (two) times daily.    [provider]  mirtazapine (REMERON) 15 MG tablet Take 15 mg by mouth at bedtime. 01/19/20   [provider]  risperiDONE (RISPERDAL) 0.5 MG tablet Take 1 tablet (0.5 mg total) by mouth every morning. Patient taking differently: Take 0.5 mg by mouth 2 (two) times daily.  08/14/18   Cottle, Steva Ready., MD  vitamin B-12 (CYANOCOBALAMIN) 1000 MCG  tablet Take 1,000 mcg by mouth daily.    [provider]    Allergies    Patient has no known allergies.  Review of Systems   Review of Systems  Unable to perform ROS: Dementia    Physical Exam Updated Vital Signs BP 121/89 (BP Location: Left Arm)   Pulse 92   Temp 98.3 F (36.8 C) (Axillary)   Resp 18   SpO2 95%   Physical Exam Constitutional:      General: He is not in acute distress.    Appearance: He is well-developed.  HENT:     Head: Normocephalic.     Comments: Forehead hematoma approximately 2 x 3 cm with a central area of abrasion.  No laceration noted.    Mouth/Throat:     Mouth: Mucous membranes are moist.  Neck:     Comments: No tenderness elicited palpation of C-spine T-spine or L-spine.  Patient appears to be moving his neck freely. Cardiovascular:     Rate and Rhythm: Normal rate.  Pulmonary:     Effort: Pulmonary effort is normal.  Abdominal:     Palpations: Abdomen is soft.  Musculoskeletal:     Right lower leg: No edema.     Left lower leg: No edema.  Skin:    General: Skin is warm.     Capillary Refill: Capillary refill takes less than 2  seconds.  Neurological:     Mental Status: He is alert.     Comments: Limited neuro exam due to patient's baseline mental status.  However appears to be moving all extremities without pain or discomfort.     ED Results / Procedures / Treatments   Labs (all labs ordered are listed, but only abnormal results are displayed) Labs Reviewed - No data to display  EKG None  Radiology No results found.  Procedures Procedures (including critical care time)  Medications Ordered in ED Medications  Tdap (BOOSTRIX) injection 0.5 mL (has no administration in time range)    ED Course  I have reviewed the triage vital signs and the nursing notes.  Pertinent labs & imaging results that were available during my care of the patient were reviewed by me and considered in my medical decision making (see  chart for details).    MDM Rules/Calculators/A&P                          Patient tetanus updated.  Case discussed with his wife.  Patient will be discharged back to his facility, advised follow-up with his doctor within the week as needed.  Advised immediate return if he has any additional concerns or fevers or other illnesses.   Final Clinical Impression(s) / ED Diagnoses Final diagnoses:  Injury of head, initial encounter    Rx / DC Orders ED Discharge Orders    None       Cheryll Cockayne, MD 09/04/20 1355

## 2020-09-04 NOTE — ED Notes (Signed)
Report given to Shona Needles RN staff member at [facility].

## 2020-09-04 NOTE — ED Triage Notes (Signed)
Pt BIB EMS. Pt coming from memory unit at [facility]. Pt walking and hit his head on the wall and fell on the floor. No LOC, no blood thinners. Pt has dementia and leans to the left which is baseline for him. Pt has hematoma and lac to the forehead. Pt responds with "ok, and yeah" when asked questions about pain.   BP-139/82 HR-94 RR-20 Temp-98.4

## 2021-05-04 ENCOUNTER — Emergency Department (HOSPITAL_COMMUNITY): Payer: Medicare Other

## 2021-05-04 ENCOUNTER — Other Ambulatory Visit: Payer: Self-pay

## 2021-05-04 ENCOUNTER — Emergency Department (HOSPITAL_COMMUNITY)
Admission: EM | Admit: 2021-05-04 | Discharge: 2021-05-05 | Disposition: A | Discharge status: Home / Self Care | Payer: Medicare Other | Attending: Emergency Medicine | Admitting: Emergency Medicine

## 2021-05-04 DIAGNOSIS — I10 Essential (primary) hypertension: Secondary | ICD-10-CM | POA: Insufficient documentation

## 2021-05-04 DIAGNOSIS — F0390 Unspecified dementia without behavioral disturbance: Secondary | ICD-10-CM | POA: Insufficient documentation

## 2021-05-04 DIAGNOSIS — Z20822 Contact with and (suspected) exposure to covid-19: Secondary | ICD-10-CM | POA: Insufficient documentation

## 2021-05-04 DIAGNOSIS — R4182 Altered mental status, unspecified: Secondary | ICD-10-CM | POA: Diagnosis present

## 2021-05-04 LAB — CBC WITH DIFFERENTIAL/PLATELET
Abs Immature Granulocytes: 0.04 10*3/uL (ref 0.00–0.07)
Basophils Absolute: 0.1 10*3/uL (ref 0.0–0.1)
Basophils Relative: 1 %
Eosinophils Absolute: 0.2 10*3/uL (ref 0.0–0.5)
Eosinophils Relative: 2 %
HCT: 40.6 % (ref 39.0–52.0)
Hemoglobin: 13 g/dL (ref 13.0–17.0)
Immature Granulocytes: 0 %
Lymphocytes Relative: 23 %
Lymphs Abs: 2 10*3/uL (ref 0.7–4.0)
MCH: 29.7 pg (ref 26.0–34.0)
MCHC: 32 g/dL (ref 30.0–36.0)
MCV: 92.7 fL (ref 80.0–100.0)
Monocytes Absolute: 0.8 10*3/uL (ref 0.1–1.0)
Monocytes Relative: 8 %
Neutro Abs: 5.9 10*3/uL (ref 1.7–7.7)
Neutrophils Relative %: 66 %
Platelets: 213 10*3/uL (ref 150–400)
RBC: 4.38 MIL/uL (ref 4.22–5.81)
RDW: 13.4 % (ref 11.5–15.5)
WBC: 8.9 10*3/uL (ref 4.0–10.5)
nRBC: 0 % (ref 0.0–0.2)

## 2021-05-04 LAB — URINALYSIS, ROUTINE W REFLEX MICROSCOPIC
Bilirubin Urine: NEGATIVE
Glucose, UA: NEGATIVE mg/dL
Hgb urine dipstick: NEGATIVE
Ketones, ur: 5 mg/dL — AB
Leukocytes,Ua: NEGATIVE
Nitrite: NEGATIVE
Protein, ur: NEGATIVE mg/dL
Specific Gravity, Urine: 1.03 (ref 1.005–1.030)
pH: 5 (ref 5.0–8.0)

## 2021-05-04 LAB — COMPREHENSIVE METABOLIC PANEL
ALT: 24 U/L (ref 0–44)
AST: 49 U/L — ABNORMAL HIGH (ref 15–41)
Albumin: 4 g/dL (ref 3.5–5.0)
Alkaline Phosphatase: 57 U/L (ref 38–126)
Anion gap: 8 (ref 5–15)
BUN: 34 mg/dL — ABNORMAL HIGH (ref 8–23)
CO2: 25 mmol/L (ref 22–32)
Calcium: 9 mg/dL (ref 8.9–10.3)
Chloride: 110 mmol/L (ref 98–111)
Creatinine, Ser: 0.84 mg/dL (ref 0.61–1.24)
GFR, Estimated: >60 mL/min (ref >60)
Glucose, Bld: 93 mg/dL (ref 70–99)
Potassium: 3.5 mmol/L (ref 3.5–5.1)
Sodium: 143 mmol/L (ref 135–145)
Total Bilirubin: 0.4 mg/dL (ref 0.3–1.2)
Total Protein: 7.2 g/dL (ref 6.5–8.1)

## 2021-05-04 LAB — RESP PANEL BY RT-PCR (FLU A&B, COVID) ARPGX2
Influenza A by PCR: NEGATIVE
Influenza B by PCR: NEGATIVE
SARS Coronavirus 2 by RT PCR: NEGATIVE

## 2021-05-04 MED ORDER — HALOPERIDOL LACTATE 5 MG/ML IJ SOLN
2.5000 mg | Freq: Once | INTRAMUSCULAR | Status: AC
  Administered 2021-05-04: 2.5 mg via INTRAMUSCULAR
  Filled 2021-05-04: qty 1

## 2021-05-04 MED ORDER — SODIUM CHLORIDE 0.9 % IV BOLUS
1000.0000 mL | Freq: Once | INTRAVENOUS | Status: AC
  Administered 2021-05-04: 1000 mL via INTRAVENOUS

## 2021-05-04 MED ORDER — LORAZEPAM 2 MG/ML IJ SOLN
2.0000 mg | Freq: Once | INTRAMUSCULAR | Status: AC
  Administered 2021-05-04: 2 mg via INTRAMUSCULAR
  Filled 2021-05-04: qty 1

## 2021-05-04 NOTE — ED Provider Notes (Signed)
[facility]-EMERGENCY DEPT Provider Note   CSN: [id number] Arrival date & time: 05/04/21  1558     History Chief Complaint  Patient presents with   Altered Mental Status    Patient is at baseline however he stopped ambulating     [name] is a 65 y.o. male.  65 year old male with advanced Alzheimer's dementia.  He presents today with concerns over decreased ambulation over the last 3 to 4 days.  Patient is unable to provide history.  Level 5 caveat secondary to same.  Patient's mental status is unchanged per report of staff.  Patient with decreased ambulation over the last several days.  No reported falls or injury.  No reported fever.    The history is provided by the patient and a relative.  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     No family history on file.  Social History   Tobacco Use   Smoking status: Never   Smokeless tobacco: Never  Vaping Use   Vaping Use: Never used  Substance Use Topics   Alcohol use: No   Drug use: No    Home Medications Prior to Admission medications   Medication Sig Start Date End Date Taking? Authorizing Provider  BYSTOLIC 5 MG tablet TAKE 1 TABLET BY MOUTH EVERY DAY Patient taking differently: Take 5 mg by mouth daily.  12/06/19   Cottle, Steva Ready., MD  memantine (NAMENDA) 10 MG tablet Take 10 mg by mouth 2 (two) times daily.    [provider]  mirtazapine (REMERON) 15 MG tablet Take 15 mg by mouth at bedtime. 01/19/20   [provider]  risperiDONE (RISPERDAL) 0.5 MG tablet Take 1 tablet (0.5 mg total) by mouth every morning. Patient taking differently: Take 0.5 mg by mouth 2 (two) times daily.  08/14/18   Cottle, Steva Ready., MD  vitamin B-12 (CYANOCOBALAMIN) 1000 MCG tablet Take 1,000 mcg by mouth daily.    [provider]    Allergies    Patient has no known allergies.  Review of Systems   Review of Systems  All other systems reviewed and are negative.  Physical Exam Updated Vital Signs BP 123/87 (BP Location: Left Arm)   Pulse 91   Temp 98.2 F (36.8 C) (Oral)   Resp (!) 21   Ht 5\' 6"  (1.676 m)   Wt 59.9 kg   SpO2 97%   BMI 21.31 kg/m   Physical Exam Vitals and nursing note reviewed.  Constitutional:      General: He is not in acute distress.    Appearance: Normal appearance. He is well-developed.  HENT:     Head: Normocephalic and atraumatic.  Eyes:     Conjunctiva/sclera: Conjunctivae normal.     Pupils: Pupils are equal, round, and reactive to light.  Cardiovascular:     Rate and Rhythm: Normal rate and regular rhythm.  Heart sounds: Normal heart sounds.  Pulmonary:     Effort: Pulmonary effort is normal. No respiratory distress.     Breath sounds: Normal breath sounds.  Abdominal:     General: There is no distension.     Palpations: Abdomen is soft.     Tenderness: There is no abdominal tenderness.  Musculoskeletal:        General: No deformity. Normal range of motion.     Cervical back: Normal range of motion and neck supple.  Skin:    General: Skin is warm and dry.  Neurological:     Mental Status: He is alert.     Comments: Patient with advanced dementia.  Patient unable to answer questions or interact with examiner.    ED Results / Procedures / Treatments   Labs (all labs ordered are listed, but only abnormal results are displayed) Labs Reviewed   URINALYSIS, ROUTINE W REFLEX MICROSCOPIC - Abnormal; Notable for the following components:      Result Value   Ketones, ur 5 (*)    All other components within normal limits  COMPREHENSIVE METABOLIC PANEL - Abnormal; Notable for the following components:   BUN 34 (*)    AST 49 (*)    All other components within normal limits  RESP PANEL BY RT-PCR (FLU A&B, COVID) ARPGX2  CBC WITH DIFFERENTIAL/PLATELET    EKG EKG Interpretation  Date/Time:  Monday May 04 2021 16:55:25 EDT Ventricular Rate:  89 PR Interval:  182 QRS Duration: 100 QT Interval:  361 QTC Calculation: 440 R Axis:   -12 Text Interpretation: Sinus rhythm Abnormal R-wave progression, early transition Artifact in lead(s) I II III aVR aVL aVF Confirmed by Kristine Royal [phone]) on 05/04/2021 5:12:53 PM  Radiology CT Head Wo Contrast  Result Date: 05/04/2021 CLINICAL DATA:  Mental status change EXAM: CT HEAD WITHOUT CONTRAST TECHNIQUE: Contiguous axial images were obtained from the base of the skull through the vertex without intravenous contrast. COMPARISON:  Head CT 07/07/2019 FINDINGS: Brain: No acute intracranial hemorrhage. No focal mass lesion. No CT evidence of acute infarction. No midline shift or mass effect. Moderate hydrocephalus related to cortical atrophy. Degree of hydrocephalus appears slightly increased from most recent comparison CT with increased prominence of the temporal horns. Basilar cisterns are patent. There are periventricular and subcortical white matter hypodensities. Generalized cortical atrophy. Vascular: No hyperdense vessel or unexpected calcification. Skull: Normal. Negative for fracture or focal lesion. Sinuses/Orbits: Paranasal sinuses and mastoid air cells are clear. Orbits are clear. Other: None. IMPRESSION: 1. No acute intracranial findings. 2. Hydrocephalus mildly increased from CT 02/04/2020. 3. Generalized cortical atrophy. 4. Periventricular white matter hypodensities are stable.  Electronically Signed   By: Genevive Bi M.D.   On: 05/04/2021 20:43   DG Chest Port 1 View  Result Date: 05/04/2021 CLINICAL DATA:  Shortness of breath, altered mental status EXAM: PORTABLE CHEST 1 VIEW COMPARISON:  08/06/2016 FINDINGS: Heart and mediastinal contours are within normal limits. No focal opacities or effusions. No acute bony abnormality. IMPRESSION: No active disease. Electronically Signed   By: Charlett Nose M.D.   On: 05/04/2021 22:17    Procedures Procedures   Medications Ordered in ED Medications  sodium chloride 0.9 % bolus 1,000 mL (has no administration in time range)  LORazepam (ATIVAN) injection 2 mg (has no administration in time range)    ED Course  I have reviewed the triage vital signs and the nursing notes.  Pertinent labs & imaging results that were available during my  care of the patient were reviewed by me and considered in my medical decision making (see chart for details).    MDM Rules/Calculators/A&P                           MDM  MSE complete  ANATOLE APOLLO was evaluated in Emergency Department on 05/04/2021 for the symptoms described in the history of present illness. He was evaluated in the context of the global COVID-19 pandemic, which necessitated consideration that the patient might be at risk for infection with the SARS-CoV-2 virus that causes COVID-19. Institutional protocols and algorithms that pertain to the evaluation of patients at risk for COVID-19 are in a state of rapid change based on information released by regulatory bodies including the CDC and federal and state organizations. These policies and algorithms were followed during the patient's care in the ED.  Patient presented for evaluation.  Patient with advanced dementia.  Work-up is without significant acute abnormality found on screening labs, chest x-ray, or CT head.  Patient appears to be appropriate for discharge back to his facility.  Importance of close follow-up  was stressed.  Strict return precautions given and understood.   Final Clinical Impression(s) / ED Diagnoses Final diagnoses:  Dementia without behavioral disturbance, unspecified dementia type [facility])    Rx / DC Orders ED Discharge Orders     None        Wynetta Fines, MD 05/04/21 2357

## 2021-05-04 NOTE — ED Notes (Signed)
Patient has been confused and disoriented all shift. Patient does not follow commands and does not appear to understand what is being said. Patient  Will not stop wiggling so VS are not accurate

## 2021-05-04 NOTE — ED Triage Notes (Signed)
Patient BIB [facility] from [facility] for  decrease in motility. EMS reports that patient has history of early dementia. Staff stated this is his baseline mentation however he will not ambulate or sit up on his own.

## 2021-05-04 NOTE — ED Notes (Signed)
Patient was place on oxygen because his oxygen level were in the 70's-89's and place on 2 Liters of oxygen. Patient is confused and has mittens on because the patient was taken everything off. However, Patient is now calm and watching television as he is trying to go to sleep.

## 2021-05-04 NOTE — Discharge Instructions (Addendum)
Return for any problem.   Work up in the ED did not demonstrate acute infection or other abnormality.

## 2021-05-05 NOTE — ED Notes (Signed)
Non emergent transport contacted for transfer to [facility]. Patient is discharged

## 2021-05-05 NOTE — ED Notes (Signed)
[facility]AR here for transport back to [facility]

## 2021-05-05 NOTE — ED Notes (Signed)
Attempted to call SN[facility] [phone]. No answer

## 2021-05-05 NOTE — ED Notes (Signed)
Pt was repositioned, soiled brief changed.

## 2021-06-14 ENCOUNTER — Emergency Department (HOSPITAL_COMMUNITY): Payer: Medicare Other

## 2021-06-14 ENCOUNTER — Other Ambulatory Visit: Payer: Self-pay

## 2021-06-14 ENCOUNTER — Emergency Department (HOSPITAL_COMMUNITY)
Admission: EM | Admit: 2021-06-14 | Discharge: 2021-06-15 | Disposition: A | Discharge status: Home / Self Care | Payer: Medicare Other | Attending: Emergency Medicine | Admitting: Emergency Medicine

## 2021-06-14 DIAGNOSIS — R5383 Other fatigue: Secondary | ICD-10-CM | POA: Insufficient documentation

## 2021-06-14 DIAGNOSIS — W19XXXA Unspecified fall, initial encounter: Secondary | ICD-10-CM | POA: Insufficient documentation

## 2021-06-14 DIAGNOSIS — I10 Essential (primary) hypertension: Secondary | ICD-10-CM | POA: Diagnosis not present

## 2021-06-14 DIAGNOSIS — R410 Disorientation, unspecified: Secondary | ICD-10-CM | POA: Diagnosis present

## 2021-06-14 DIAGNOSIS — G309 Alzheimer's disease, unspecified: Secondary | ICD-10-CM | POA: Diagnosis not present

## 2021-06-14 DIAGNOSIS — Z20822 Contact with and (suspected) exposure to covid-19: Secondary | ICD-10-CM | POA: Insufficient documentation

## 2021-06-14 NOTE — ED Triage Notes (Signed)
Pt [facility] from [facility] memory care unit. Possible fall, pt was found on the ground. No injuries noted. No bloodthinners.

## 2021-06-14 NOTE — ED Provider Notes (Signed)
[facility]-EMERGENCY DEPT Provider Note   CSN: [id number] Arrival date & time: 06/14/21  1950     History Chief Complaint  Patient presents with   [name] is a 65 y.o. male.   Fall    64 M who had an unwitnessed fall, immediately after seemed "lethargic and confused, stuporous." Normally will ambulate on his own, has had some recent falls. No injuries noted by staff. No seizure like activity. Baseline mental status, confused at baseline, AAOx1. Level 5 caveat due to patient dementia.   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     No family history on file.  Social History   Tobacco Use   Smoking status: Never   Smokeless tobacco: Never  Vaping Use   Vaping Use: Never used  Substance Use Topics   Alcohol use: No   Drug use: No    Home Medications Prior to Admission medications   Medication Sig Start Date End Date Taking? Authorizing Provider  BYSTOLIC 5 MG tablet TAKE 1 TABLET BY MOUTH EVERY DAY Patient taking differently: Take 5 mg by mouth daily. 12/06/19   Cottle, Steva Ready., MD  divalproex (DEPAKOTE) 125 MG DR tablet Take 125 mg by mouth 2 (two) times daily.    [provider]  memantine (NAMENDA) 10 MG tablet Take 10 mg by mouth 2 (two) times daily.    [provider]  risperiDONE (RISPERDAL) 0.5 MG tablet Take 1 tablet (0.5 mg total) by mouth every morning. Patient not taking: No sig reported 08/14/18   Cottle, Steva Ready., MD  vitamin B-12 (CYANOCOBALAMIN) 1000 MCG tablet Take 1,000 mcg by mouth daily.    [provider]    Allergies    Patient  has no known allergies.  Review of Systems   Review of Systems  Unable to perform ROS: Dementia   Physical Exam Updated Vital Signs BP (!) 134/104 (BP Location: Right Arm)   Pulse 60   Temp 97.6 F (36.4 C) (Axillary)   Resp 16   SpO2 99%   Physical Exam Vitals and nursing note reviewed.  Constitutional:      Appearance: He is well-developed.     Comments: GCS 14, ABC intact, dementia, appears at baseline  HENT:     Head: Normocephalic.  Eyes:     Conjunctiva/sclera: Conjunctivae normal.  Neck:     Comments: No midline tenderness to palpation of the cervical spine. ROM intact. Cardiovascular:     Rate and Rhythm: Normal rate and regular rhythm.     Heart sounds: No murmur heard. Pulmonary:     Effort: Pulmonary effort is normal. No respiratory distress.     Breath sounds: Normal breath sounds.  Chest:     Comments: Chest wall stable and non-tender to AP and lateral compression. Clavicles stable and non-tender to AP compression Abdominal:     Palpations: Abdomen is soft.     Tenderness: There is no abdominal tenderness.     Comments: Pelvis stable to lateral compression.  Musculoskeletal:     Cervical back: Neck supple.     Comments: No  midline tenderness to palpation of the thoracic or lumbar spine. Extremities atraumatic with intact ROM.   Skin:    General: Skin is warm and dry.  Neurological:     Mental Status: He is alert.     Comments: CN II-XII grossly intact. Moving all four extremities spontaneously and sensation grossly intact.    ED Results / Procedures / Treatments   Labs (all labs ordered are listed, but only abnormal results are displayed) Labs Reviewed  BASIC METABOLIC PANEL - Abnormal; Notable for the following components:      Result Value   Calcium 8.4 (*)    All other components within normal limits  HEPATIC FUNCTION PANEL - Abnormal; Notable for the following components:   Bilirubin, Direct 0.3 (*)    All other components within normal  limits  RESP PANEL BY RT-PCR (FLU A&B, COVID) ARPGX2  CBC WITH DIFFERENTIAL/PLATELET  URINALYSIS, ROUTINE W REFLEX MICROSCOPIC  CBG MONITORING, ED    EKG EKG Interpretation  Date/Time:  Monday June 15 2021 01:48:30 EDT Ventricular Rate:  65 PR Interval:  196 QRS Duration: 86 QT Interval:  414 QTC Calculation: 430 R Axis:   -2 Text Interpretation: Normal sinus rhythm  no acute ST/T changes Confirmed by Pricilla Loveless [phone]) on 06/15/2021 2:14:35 AM  Radiology DG Pelvis 1-2 Views  Result Date: 06/15/2021 CLINICAL DATA:  Status post fall. EXAM: PELVIS - 1-2 VIEW COMPARISON:  None. FINDINGS: There is no evidence of pelvic fracture or diastasis. No pelvic bone lesions are seen. IMPRESSION: Negative. Electronically Signed   By: Aram Candela M.D.   On: 06/15/2021 00:15   CT HEAD WO CONTRAST ( )  Result Date: 06/14/2021 CLINICAL DATA:  Altered mental status. EXAM: CT HEAD WITHOUT CONTRAST TECHNIQUE: Contiguous axial images were obtained from the base of the skull through the vertex without intravenous contrast. COMPARISON:  May 04, 2021 FINDINGS: Brain: There is moderate severity cerebral atrophy with widening of the extra-axial spaces and stable ventricular dilatation. There are areas of decreased attenuation within the white matter tracts of the supratentorial brain, consistent with microvascular disease changes. Vascular: No hyperdense vessel or unexpected calcification. Skull: Normal. Negative for fracture or focal lesion. Sinuses/Orbits: Very mild right maxillary sinus mucosal thickening is seen. Other: None. IMPRESSION: 1. Generalized cerebral atrophy. 2. No acute intracranial abnormality. Electronically Signed   By: Aram Candela M.D.   On: 06/14/2021 21:30   CT Cervical Spine Wo Contrast  Result Date: 06/14/2021 CLINICAL DATA:  Status post trauma. EXAM: CT CERVICAL SPINE WITHOUT CONTRAST TECHNIQUE: Multidetector CT imaging of the cervical spine was performed without  intravenous contrast. Multiplanar CT image reconstructions were also generated. COMPARISON:  None. FINDINGS: Alignment: Normal. Skull base and vertebrae: No acute fracture. No primary bone lesion or focal pathologic process. Soft tissues and spinal canal: No prevertebral fluid or swelling. No visible canal hematoma. Disc levels: Mild endplate sclerosis and anterior osteophyte formation is seen at the levels of C5-C6 and C6-C7. There is mild to moderate severity narrowing of the anterior atlantoaxial articulation. Normal multilevel intervertebral disc spaces are seen. Mild, bilateral multilevel facet joint hypertrophy is noted. Upper chest: Negative. Other: None. IMPRESSION: 1. No acute osseous abnormality of the cervical spine. 2. Mild degenerative changes, most prominent at the levels of C5-C6 and C6-C7. Electronically Signed   By: Aram Candela M.D.   On: 06/14/2021 21:33   DG Chest Portable 1 View  Result Date: 06/15/2021 CLINICAL DATA:  Status post fall. EXAM: PORTABLE CHEST 1 VIEW COMPARISON:  May 04, 2021 FINDINGS: Low lung volumes are noted. Mild atelectasis is seen along the lateral aspect of the left lung base. There is no evidence of a pleural effusion or pneumothorax. The heart size and mediastinal contours are within normal limits. The visualized skeletal structures are unremarkable. IMPRESSION: Low lung volumes with mild left basilar atelectasis. Electronically Signed   By: Aram Candela M.D.   On: 06/15/2021 00:16    Procedures Procedures   Medications Ordered in ED Medications - No data to display  ED Course  I have reviewed the triage vital signs and the nursing notes.  Pertinent labs & imaging results that were available during my care of the patient were reviewed by me and considered in my medical decision making (see chart for details).    MDM Rules/Calculators/A&P                           65 year old male with a history of dementia residing at a memory care unit  who presents after an unwitnessed fall at his facility.  Reportedly had some somnolence after his fall and.  Off his baseline and was transferred to the emergency department for further evaluation.  In the ED, the patient appears to be at his baseline, alert and oriented x1, following simple commands, no clear traumatic injury identified.  CT of the head and cervical spine was performed without acute intracranial abnormality, no evidence of fracture or malalignment of the cervical spine.  Given the questionable report of decreased alertness, will initiate broader work-up with screening labs and x-ray imaging.  Work-up pending at time of signout.  Signout given to Dr. Criss Alvine at 0000. Final Clinical Impression(s) / ED Diagnoses Final diagnoses:  Fall, initial encounter    Rx / DC Orders ED Discharge Orders     None        Ernie Avena, MD 06/15/21 1247

## 2021-06-15 LAB — BASIC METABOLIC PANEL
Anion gap: 10 (ref 5–15)
BUN: 21 mg/dL (ref 8–23)
CO2: 22 mmol/L (ref 22–32)
Calcium: 8.4 mg/dL — ABNORMAL LOW (ref 8.9–10.3)
Chloride: 106 mmol/L (ref 98–111)
Creatinine, Ser: 0.71 mg/dL (ref 0.61–1.24)
GFR, Estimated: >60 mL/min (ref >60)
Glucose, Bld: 81 mg/dL (ref 70–99)
Potassium: 4.3 mmol/L (ref 3.5–5.1)
Sodium: 138 mmol/L (ref 135–145)

## 2021-06-15 LAB — CBC WITH DIFFERENTIAL/PLATELET
Abs Immature Granulocytes: 0.06 10*3/uL (ref 0.00–0.07)
Basophils Absolute: 0.1 10*3/uL (ref 0.0–0.1)
Basophils Relative: 1 %
Eosinophils Absolute: 0.2 10*3/uL (ref 0.0–0.5)
Eosinophils Relative: 2 %
HCT: 42.2 % (ref 39.0–52.0)
Hemoglobin: 13.6 g/dL (ref 13.0–17.0)
Immature Granulocytes: 1 %
Lymphocytes Relative: 23 %
Lymphs Abs: 2.2 10*3/uL (ref 0.7–4.0)
MCH: 29.7 pg (ref 26.0–34.0)
MCHC: 32.2 g/dL (ref 30.0–36.0)
MCV: 92.1 fL (ref 80.0–100.0)
Monocytes Absolute: 0.9 10*3/uL (ref 0.1–1.0)
Monocytes Relative: 9 %
Neutro Abs: 6 10*3/uL (ref 1.7–7.7)
Neutrophils Relative %: 64 %
Platelets: 221 10*3/uL (ref 150–400)
RBC: 4.58 MIL/uL (ref 4.22–5.81)
RDW: 13.6 % (ref 11.5–15.5)
WBC: 9.3 10*3/uL (ref 4.0–10.5)
nRBC: 0 % (ref 0.0–0.2)

## 2021-06-15 LAB — URINALYSIS, ROUTINE W REFLEX MICROSCOPIC
Bilirubin Urine: NEGATIVE
Glucose, UA: NEGATIVE mg/dL
Hgb urine dipstick: NEGATIVE
Ketones, ur: NEGATIVE mg/dL
Leukocytes,Ua: NEGATIVE
Nitrite: NEGATIVE
Protein, ur: NEGATIVE mg/dL
Specific Gravity, Urine: 1.018 (ref 1.005–1.030)
pH: 6 (ref 5.0–8.0)

## 2021-06-15 LAB — HEPATIC FUNCTION PANEL
ALT: 15 U/L (ref 0–44)
AST: 22 U/L (ref 15–41)
Albumin: 3.6 g/dL (ref 3.5–5.0)
Alkaline Phosphatase: 55 U/L (ref 38–126)
Bilirubin, Direct: 0.3 mg/dL — ABNORMAL HIGH (ref 0.0–0.2)
Indirect Bilirubin: 0.9 mg/dL (ref 0.3–0.9)
Total Bilirubin: 1.2 mg/dL (ref 0.3–1.2)
Total Protein: 6.9 g/dL (ref 6.5–8.1)

## 2021-06-15 LAB — RESP PANEL BY RT-PCR (FLU A&B, COVID) ARPGX2
Influenza A by PCR: NEGATIVE
Influenza B by PCR: NEGATIVE
SARS Coronavirus 2 by RT PCR: NEGATIVE

## 2021-06-15 LAB — CBG MONITORING, ED: Glucose-Capillary: 91 mg/dL (ref 70–99)

## 2021-06-15 NOTE — ED Provider Notes (Signed)
Care transferred to me.  Labs are unremarkable.  Urine shows no UTI.  CT head, C-spine, and chest/pelvis x-rays are unremarkable.  Patient does not follow commands well but with nursing I was able to get him to stand up and take a few steps and it seems like he is able to bear weight.  No pain with ROM of hips. Will discharge back to facility.   Pricilla Loveless, MD 06/15/21 [phone]

## 2021-09-30 ENCOUNTER — Encounter (HOSPITAL_COMMUNITY): Payer: Self-pay

## 2021-09-30 ENCOUNTER — Other Ambulatory Visit: Payer: Self-pay

## 2021-09-30 ENCOUNTER — Emergency Department (HOSPITAL_COMMUNITY)
Admission: EM | Admit: 2021-09-30 | Discharge: 2021-09-30 | Disposition: A | Discharge status: Home / Self Care | Payer: Medicare Other | Attending: Emergency Medicine | Admitting: Emergency Medicine

## 2021-09-30 ENCOUNTER — Emergency Department (HOSPITAL_COMMUNITY): Payer: Medicare Other

## 2021-09-30 DIAGNOSIS — W050XXA Fall from non-moving wheelchair, initial encounter: Secondary | ICD-10-CM | POA: Insufficient documentation

## 2021-09-30 DIAGNOSIS — Z23 Encounter for immunization: Secondary | ICD-10-CM | POA: Diagnosis not present

## 2021-09-30 DIAGNOSIS — Y92129 Unspecified place in nursing home as the place of occurrence of the external cause: Secondary | ICD-10-CM | POA: Insufficient documentation

## 2021-09-30 DIAGNOSIS — S0990XA Unspecified injury of head, initial encounter: Secondary | ICD-10-CM | POA: Diagnosis present

## 2021-09-30 DIAGNOSIS — S0181XA Laceration without foreign body of other part of head, initial encounter: Secondary | ICD-10-CM | POA: Insufficient documentation

## 2021-09-30 DIAGNOSIS — G309 Alzheimer's disease, unspecified: Secondary | ICD-10-CM | POA: Diagnosis not present

## 2021-09-30 DIAGNOSIS — W19XXXA Unspecified fall, initial encounter: Secondary | ICD-10-CM

## 2021-09-30 MED ORDER — LORAZEPAM 2 MG/ML IJ SOLN
2.0000 mg | Freq: Once | INTRAMUSCULAR | Status: AC
  Administered 2021-09-30: 2 mg via INTRAMUSCULAR
  Filled 2021-09-30: qty 1

## 2021-09-30 MED ORDER — TETANUS-DIPHTH-ACELL PERTUSSIS 5-2.5-18.5 LF-MCG/0.5 IM SUSY
0.5000 mL | PREFILLED_SYRINGE | Freq: Once | INTRAMUSCULAR | Status: AC
  Administered 2021-09-30: 0.5 mL via INTRAMUSCULAR
  Filled 2021-09-30: qty 0.5

## 2021-09-30 MED ORDER — LIDOCAINE-EPINEPHRINE (PF) 2 %-1:200000 IJ SOLN
10.0000 mL | Freq: Once | INTRAMUSCULAR | Status: AC
  Administered 2021-09-30: 7 mL
  Filled 2021-09-30: qty 20

## 2021-09-30 MED ORDER — BACITRACIN ZINC 500 UNIT/GM EX OINT
TOPICAL_OINTMENT | Freq: Two times a day (BID) | CUTANEOUS | Status: DC
  Administered 2021-09-30: 1 via TOPICAL
  Filled 2021-09-30: qty 1.8

## 2021-09-30 NOTE — ED Provider Notes (Signed)
[facility]GENCY [facility]PT Provider Note   CSN: [id number] Arrival date & time: 09/30/21  1704     History  Chief Complaint  Patient presents with   Marvin GongFall    [name] is a 66 y.o. male.  Patient with history of advanced Alzheimers dementia presents today for fall. Fall was unwitness[facility]. Patient is alert and oriented x 0 and mumbling inaudibly which is his baseline. Therefore all history provided by nursing facility and EMS.  Nursing facility states that he is wheelchair-bound at baseline and was found on the ground.  Laceration noted to forehead.  Nursing home staff states that his consciousness and acting normally when they picked him up off the floor. He is not anticoagulated.   Level 5 caveat---dementia  The history is provided by the nursing home and the EMS personnel. No language interpreter was used.  Fall      Home Medications Prior to Admission medications   Medication Sig Start Date End Date Taking? Authorizing Provider  BYSTOLIC 5 MG tablet TAKE 1 TABLET BY MOUTH EVERY DAY Patient taking differently: Take 5 mg by mouth daily. 12/06/19   Cottle, Steva Readyarey G Jr., MD  divalproex (DEPAKOTE) 125 MG DR tablet Take 125 mg by mouth 2 (two) times daily.    [provider]  memantine (NAMENDA) 10 MG tablet Take 10 mg by mouth 2 (two) times daily.    [provider]  risperiDONE (RISPERDAL) 0.5 MG tablet Take 1 tablet (0.5 mg total) by mouth every morning. Patient not taking: No sig reported 08/14/18   Cottle, Steva Readyarey G Jr., MD  vitamin B-12 (CYANOCOBALAMIN) 1000 MCG tablet Take 1,000 mcg by mouth daily.    [provider]      Allergies    Patient has no known allergies.    Review of Systems   Review of Systems  Unable to perform ROS: Dementia  All other systems reviewed and are negative.  Physical Exam Updated Vital Signs BP 131/81    Pulse 88    Temp 98.4 F (36.9 C) (Oral)     Resp 18    Ht 5\' 6"  (1.676 m)    Wt 59.9 kg    SpO2 99%    BMI 21.31 kg/m  Physical Exam Vitals and nursing note reviewed.  Constitutional:      Comments: Patient chronically ill-appearing laying in bed in no obvious distress  HENT:     Head:     Comments: 4 cm vertical laceration noted to the middle of the forehead. Bleeding controlled.  No battle's sign or racoon eyes    Ears:     Comments: No hemotympanum    Mouth/Throat:     Mouth: Mucous membranes are moist.  Eyes:     Extraocular Movements: Extraocular movements intact.     Pupils: Pupils are equal, round, and reactive to light.  Neck:     Comments: No midline tenderness, stepoffs, wounds, or deformity noted to cervical, thoracic, or lumbar spine Cardiovascular:     Rate and Rhythm: Normal rate and regular rhythm.     Heart sounds: Normal heart sounds.  Pulmonary:     Effort: Pulmonary effort is normal. No respiratory distress.     Breath sounds: Normal breath sounds. No wheezing or rales.  Abdominal:     General: Abdomen is flat.     Palpations: Abdomen is soft.     Tenderness: There is no abdominal tenderness.  Comments: No obvious abdominal tenderness or guarding  Musculoskeletal:        General: Normal range of motion.     Cervical back: Normal range of motion.     Comments: Patient unable to follow commands per baseline but moving all extremities equally without obvious pain or difficulty  Skin:    General: Skin is warm and dry.     Comments: No other bruising or injury visualized other than forehead laceration  Neurological:     Mental Status: Mental status is at baseline.    ED Results / Procedures / Treatments   Labs (all labs ordered are listed, but only abnormal results are displayed) Labs Reviewed - No data to display  EKG None  Radiology CT Head Wo Contrast  Result Date: 09/30/2021 CLINICAL DATA:  Head and neck trauma, unwitnessed fall out of wheelchair EXAM: CT HEAD WITHOUT CONTRAST CT  CERVICAL SPINE WITHOUT CONTRAST TECHNIQUE: Multidetector CT imaging of the head and cervical spine was performed following the standard protocol without intravenous contrast. Multiplanar CT image reconstructions of the cervical spine were also generated. RADIATION DOSE REDUCTION: This exam was performed according to the departmental dose-optimization program which includes automated exposure control, adjustment of the mA and/or kV according to patient size and/or use of iterative reconstruction technique. COMPARISON:  06/14/2021 FINDINGS: CT HEAD FINDINGS Brain: No evidence of acute infarction, hemorrhage, cerebral edema, mass, mass effect, or midline shift. No hydrocephalus or extra-axial fluid collection. Redemonstrated advanced cerebral atrophy for age, with ventriculomegaly that is commensurate with sulcal size. No lobar predominance to the atrophy. Periventricular white matter changes, likely the sequela of chronic small vessel ischemic disease. Vascular: No hyperdense vessel. Skull: Normal. Negative for fracture or focal lesion. Sinuses/Orbits: Mucosal thickening in the sphenoid sinuses. The orbits are unremarkable. Other: The mastoid air cells are well aerated. CT CERVICAL SPINE FINDINGS Evaluation is somewhat limited by motion artifact. Alignment: Straightening of the normal cervical lordosis. No listhesis. Skull base and vertebrae: No acute fracture. No primary bone lesion or focal pathologic process. Soft tissues and spinal canal: No prevertebral fluid or swelling. No visible canal hematoma. Disc levels: Multilevel degenerative changes without significant spinal canal stenosis. Multilevel uncovertebral and facet arthropathy, which causes severe neural foraminal narrowing on the right at C3-C4 and C4-C5. Upper chest: Limited by motion.  No focal pulmonary opacity. Other: None. IMPRESSION: 1.  No acute intracranial process. 2.  No acute fracture or traumatic listhesis in the cervical spine. Electronically  Signed   By: Wiliam Ke M.D.   On: 09/30/2021 19:06   CT Cervical Spine Wo Contrast  Result Date: 09/30/2021 CLINICAL DATA:  Head and neck trauma, unwitnessed fall out of wheelchair EXAM: CT HEAD WITHOUT CONTRAST CT CERVICAL SPINE WITHOUT CONTRAST TECHNIQUE: Multidetector CT imaging of the head and cervical spine was performed following the standard protocol without intravenous contrast. Multiplanar CT image reconstructions of the cervical spine were also generated. RADIATION DOSE REDUCTION: This exam was performed according to the departmental dose-optimization program which includes automated exposure control, adjustment of the mA and/or kV according to patient size and/or use of iterative reconstruction technique. COMPARISON:  06/14/2021 FINDINGS: CT HEAD FINDINGS Brain: No evidence of acute infarction, hemorrhage, cerebral edema, mass, mass effect, or midline shift. No hydrocephalus or extra-axial fluid collection. Redemonstrated advanced cerebral atrophy for age, with ventriculomegaly that is commensurate with sulcal size. No lobar predominance to the atrophy. Periventricular white matter changes, likely the sequela of chronic small vessel ischemic disease. Vascular: No hyperdense vessel. Skull:  Normal. Negative for fracture or focal lesion. Sinuses/Orbits: Mucosal thickening in the sphenoid sinuses. The orbits are unremarkable. Other: The mastoid air cells are well aerated. CT CERVICAL SPINE FINDINGS Evaluation is somewhat limited by motion artifact. Alignment: Straightening of the normal cervical lordosis. No listhesis. Skull base and vertebrae: No acute fracture. No primary bone lesion or focal pathologic process. Soft tissues and spinal canal: No prevertebral fluid or swelling. No visible canal hematoma. Disc levels: Multilevel degenerative changes without significant spinal canal stenosis. Multilevel uncovertebral and facet arthropathy, which causes severe neural foraminal narrowing on the right at  C3-C4 and C4-C5. Upper chest: Limited by motion.  No focal pulmonary opacity. Other: None. IMPRESSION: 1.  No acute intracranial process. 2.  No acute fracture or traumatic listhesis in the cervical spine. Electronically Signed   By: Wiliam Ke M.D.   On: 09/30/2021 19:06    Procedures .Marland KitchenLaceration Repair  Date/Time: 09/30/2021 8:58 PM Performed by: Silva Bandy, PA-C Authorized by: Silva Bandy, PA-C   Consent:    Consent obtained:  Verbal   Consent given by:  Healthcare agent   Risks, benefits, and alternatives were discussed: yes   Universal protocol:    Patient identity confirmed:  Arm band Anesthesia:    Anesthesia method:  Local infiltration   Local anesthetic:  Lidocaine 2% WITH epi Laceration details:    Location:  Face   Face location:  Forehead   Length (cm):  4   Depth (mm):  2 Treatment:    Area cleansed with:  Povidone-iodine   Amount of cleaning:  Standard   Irrigation solution:  Sterile saline   Irrigation volume:  500 ml   Irrigation method:  Pressure wash Skin repair:    Repair method:  Sutures   Suture size:  5-0   Suture material:  Fast-absorbing gut   Number of sutures:  5 Approximation:    Approximation:  Close Repair type:    Repair type:  Simple Post-procedure details:    Dressing:  Antibiotic ointment and non-adherent dressing   Procedure completion:  Tolerated well, no immediate complications    Medications Ordered in ED Medications  bacitracin ointment (has no administration in time range)  LORazepam (ATIVAN) injection 2 mg (2 mg Intramuscular Given 09/30/21 1944)  Tdap (BOOSTRIX) injection 0.5 mL (0.5 mLs Intramuscular Given 09/30/21 1946)  lidocaine-EPINEPHrine (XYLOCAINE W/EPI) 2 %-1:200000 (PF) injection 10 mL (7 mLs Infiltration Given 09/30/21 2024)    ED Course/ Medical Decision Making/ A&P                           Medical Decision Making Amount and/or Complexity of Data Reviewed Radiology: ordered.  Risk OTC  drugs. Prescription drug management.   This patient presents to the ED for concern of fall   Co morbidities that complicate the patient evaluation  Advanced dementia, A&O x 0 at baseline   Additional history obtained:  Additional history obtained from EMS and [facility] nursing facility   Lab Tests:  I Ordered, and personally interpreted labs.  The pertinent results include:  none   Imaging Studies ordered:  I ordered imaging studies including CT head and cervical spine  I independently visualized and interpreted imaging which showed no acute intracranial process, no acute fracture or traumatic listhesis in the cervical spine I agree with the radiologist interpretation    Medicines ordered and prescription drug management:  I ordered medication including Ativan in the setting of agitated and  non-directable patient with advanced dementia in order to get clear CT scans and suture his face wound.  Reevaluation of the patient after these medicines showed that the patient improved I have reviewed the patients home medicines and have made adjustments as needed   Test Considered:  I considered more detailed imaging with labs in the setting of a demented patient who is unable to answer questions or follow commands. However, upon discussing with nursing facility this is his baseline mental status. Upon my detailed physical exam I find no other acute abnormalities that would warrant further evaluation   Reevaluation:  After the interventions noted above, I reevaluated the patient and found that they have :stayed the same   Social Determinants of Health:  Patient with advanced dementia from memory care facility   Dispostion:  After consideration of the diagnostic results and the patients response to treatment, I feel that the patent would benefit from discharge with wound care and close PCP follow-up. His head wound was extensively cleaned and sutured without  complications. Imaging negative. He is at his baseline mental status.  He is afebrile, nontoxic-appearing, and in no acute distress with reassuring vital signs.  Will transfer back to his facility.  Discharged in stable condition.  Findings and plan of care discussed with supervising physician Dr. Karene FryLawsing who is in agreement.    Final Clinical Impression(s) / ED Diagnoses Final diagnoses:  Fall, initial encounter  Facial laceration, initial encounter    Rx / DC Orders ED Discharge Orders     None     An After Visit Summary was printed and given to the patient.     Vear ClockSmoot, Endiya Klahr A, PA-C 09/30/21 2234    Ernie AvenaLawsing, James, MD 10/01/21 [phone]

## 2021-09-30 NOTE — ED Triage Notes (Addendum)
Pt BIB [facility] from [facility] after unwitnessed fall out of his wheelchair. No thinners. 4cm lac to mid forehead. Bleeding controlled. Advanced dementia at baseline.

## 2021-09-30 NOTE — ED Notes (Signed)
Cleaned patient after sutures in place and applied bacitracin and nonadhesive dressing per PA.

## 2021-09-30 NOTE — ED Notes (Signed)
Called [facility] for transport back to [facility].

## 2021-09-30 NOTE — ED Notes (Signed)
Patient is being d/c'd back to [facility] to room 228-B. Called [facility] for transport and they stated he is 4th on the list.

## 2021-09-30 NOTE — ED Notes (Signed)
Tried calling [facility] to give report x2 with no response. Will continue to try getting in touch with them.

## 2021-09-30 NOTE — Discharge Instructions (Addendum)
[name] work-up in the ER this evening was unremarkable for acute abnormalities. 5 absorbable sutures were placed in his forehead. These should absorb on their own and not need follow-up to be cut out. In the interim please regularly change his dressing and ensure there are no signs of infection to his wound. Follow-up with PCP in the next few days for wound check and further evaluation.  Return if development of any new or worsening symptoms.

## 2022-07-16 ENCOUNTER — Inpatient Hospital Stay (HOSPITAL_COMMUNITY)
Admission: EM | Admit: 2022-07-16 | Discharge: 2022-07-21 | DRG: 871 | Disposition: A | Discharge status: Hospice | Payer: Medicare Other | Source: Skilled Nursing Facility | Attending: Family Medicine | Admitting: Family Medicine

## 2022-07-16 ENCOUNTER — Encounter (HOSPITAL_COMMUNITY): Payer: Self-pay | Admitting: Internal Medicine

## 2022-07-16 ENCOUNTER — Emergency Department (HOSPITAL_COMMUNITY): Payer: Medicare Other

## 2022-07-16 DIAGNOSIS — E876 Hypokalemia: Secondary | ICD-10-CM | POA: Diagnosis present

## 2022-07-16 DIAGNOSIS — D696 Thrombocytopenia, unspecified: Secondary | ICD-10-CM | POA: Diagnosis not present

## 2022-07-16 DIAGNOSIS — N179 Acute kidney failure, unspecified: Secondary | ICD-10-CM | POA: Diagnosis present

## 2022-07-16 DIAGNOSIS — Z1152 Encounter for screening for COVID-19: Secondary | ICD-10-CM | POA: Diagnosis not present

## 2022-07-16 DIAGNOSIS — Z66 Do not resuscitate: Secondary | ICD-10-CM | POA: Diagnosis present

## 2022-07-16 DIAGNOSIS — F0280 Dementia in other diseases classified elsewhere without behavioral disturbance: Secondary | ICD-10-CM | POA: Diagnosis present

## 2022-07-16 DIAGNOSIS — A419 Sepsis, unspecified organism: Principal | ICD-10-CM | POA: Diagnosis present

## 2022-07-16 DIAGNOSIS — E860 Dehydration: Secondary | ICD-10-CM | POA: Diagnosis present

## 2022-07-16 DIAGNOSIS — I10 Essential (primary) hypertension: Secondary | ICD-10-CM | POA: Diagnosis present

## 2022-07-16 DIAGNOSIS — F02C0 Dementia in other diseases classified elsewhere, severe, without behavioral disturbance, psychotic disturbance, mood disturbance, and anxiety: Secondary | ICD-10-CM | POA: Diagnosis not present

## 2022-07-16 DIAGNOSIS — G309 Alzheimer's disease, unspecified: Secondary | ICD-10-CM | POA: Diagnosis present

## 2022-07-16 DIAGNOSIS — J690 Pneumonitis due to inhalation of food and vomit: Secondary | ICD-10-CM | POA: Diagnosis not present

## 2022-07-16 DIAGNOSIS — E871 Hypo-osmolality and hyponatremia: Secondary | ICD-10-CM | POA: Diagnosis present

## 2022-07-16 DIAGNOSIS — M4628 Osteomyelitis of vertebra, sacral and sacrococcygeal region: Secondary | ICD-10-CM | POA: Diagnosis present

## 2022-07-16 DIAGNOSIS — G9341 Metabolic encephalopathy: Secondary | ICD-10-CM | POA: Diagnosis present

## 2022-07-16 DIAGNOSIS — I9589 Other hypotension: Secondary | ICD-10-CM | POA: Diagnosis not present

## 2022-07-16 DIAGNOSIS — Z515 Encounter for palliative care: Secondary | ICD-10-CM | POA: Diagnosis not present

## 2022-07-16 DIAGNOSIS — M8638 Chronic multifocal osteomyelitis, other site: Secondary | ICD-10-CM | POA: Diagnosis not present

## 2022-07-16 DIAGNOSIS — D649 Anemia, unspecified: Secondary | ICD-10-CM | POA: Diagnosis present

## 2022-07-16 DIAGNOSIS — R7401 Elevation of levels of liver transaminase levels: Secondary | ICD-10-CM | POA: Diagnosis present

## 2022-07-16 DIAGNOSIS — Z79899 Other long term (current) drug therapy: Secondary | ICD-10-CM

## 2022-07-16 DIAGNOSIS — D72829 Elevated white blood cell count, unspecified: Secondary | ICD-10-CM | POA: Diagnosis present

## 2022-07-16 DIAGNOSIS — E7800 Pure hypercholesterolemia, unspecified: Secondary | ICD-10-CM | POA: Diagnosis present

## 2022-07-16 DIAGNOSIS — Z820 Family history of epilepsy and other diseases of the nervous system: Secondary | ICD-10-CM

## 2022-07-16 DIAGNOSIS — F02C4 Dementia in other diseases classified elsewhere, severe, with anxiety: Secondary | ICD-10-CM | POA: Diagnosis present

## 2022-07-16 DIAGNOSIS — R824 Acetonuria: Secondary | ICD-10-CM | POA: Diagnosis present

## 2022-07-16 DIAGNOSIS — M8618 Other acute osteomyelitis, other site: Secondary | ICD-10-CM | POA: Diagnosis not present

## 2022-07-16 DIAGNOSIS — F411 Generalized anxiety disorder: Secondary | ICD-10-CM | POA: Diagnosis present

## 2022-07-16 DIAGNOSIS — R6520 Severe sepsis without septic shock: Secondary | ICD-10-CM | POA: Diagnosis present

## 2022-07-16 DIAGNOSIS — E870 Hyperosmolality and hypernatremia: Secondary | ICD-10-CM | POA: Diagnosis present

## 2022-07-16 DIAGNOSIS — E872 Acidosis, unspecified: Secondary | ICD-10-CM | POA: Diagnosis present

## 2022-07-16 DIAGNOSIS — L89154 Pressure ulcer of sacral region, stage 4: Secondary | ICD-10-CM | POA: Diagnosis present

## 2022-07-16 DIAGNOSIS — G300 Alzheimer's disease with early onset: Secondary | ICD-10-CM | POA: Diagnosis not present

## 2022-07-16 DIAGNOSIS — E861 Hypovolemia: Secondary | ICD-10-CM | POA: Diagnosis present

## 2022-07-16 DIAGNOSIS — Z7401 Bed confinement status: Secondary | ICD-10-CM

## 2022-07-16 DIAGNOSIS — M869 Osteomyelitis, unspecified: Secondary | ICD-10-CM | POA: Diagnosis not present

## 2022-07-16 DIAGNOSIS — R809 Proteinuria, unspecified: Secondary | ICD-10-CM | POA: Diagnosis present

## 2022-07-16 LAB — URINALYSIS, ROUTINE W REFLEX MICROSCOPIC
Bacteria, UA: NONE SEEN
Bilirubin Urine: NEGATIVE
Glucose, UA: NEGATIVE mg/dL
Hgb urine dipstick: NEGATIVE
Ketones, ur: 5 mg/dL — AB
Leukocytes,Ua: NEGATIVE
Nitrite: NEGATIVE
Protein, ur: 30 mg/dL — AB
Specific Gravity, Urine: 1.025 (ref 1.005–1.030)
pH: 5 (ref 5.0–8.0)

## 2022-07-16 LAB — CBC WITH DIFFERENTIAL/PLATELET
Abs Immature Granulocytes: 0.22 10*3/uL — ABNORMAL HIGH (ref 0.00–0.07)
Basophils Absolute: 0.1 10*3/uL (ref 0.0–0.1)
Basophils Relative: 0 %
Eosinophils Absolute: 0 10*3/uL (ref 0.0–0.5)
Eosinophils Relative: 0 %
HCT: 47.7 % (ref 39.0–52.0)
Hemoglobin: 14.1 g/dL (ref 13.0–17.0)
Immature Granulocytes: 1 %
Lymphocytes Relative: 7 %
Lymphs Abs: 1.3 10*3/uL (ref 0.7–4.0)
MCH: 28.7 pg (ref 26.0–34.0)
MCHC: 29.6 g/dL — ABNORMAL LOW (ref 30.0–36.0)
MCV: 97.1 fL (ref 80.0–100.0)
Monocytes Absolute: 1.1 10*3/uL — ABNORMAL HIGH (ref 0.1–1.0)
Monocytes Relative: 6 %
Neutro Abs: 14.4 10*3/uL — ABNORMAL HIGH (ref 1.7–7.7)
Neutrophils Relative %: 86 %
Platelets: 241 10*3/uL (ref 150–400)
RBC: 4.91 MIL/uL (ref 4.22–5.81)
RDW: 15.3 % (ref 11.5–15.5)
WBC: 17 10*3/uL — ABNORMAL HIGH (ref 4.0–10.5)
nRBC: 0.7 % — ABNORMAL HIGH (ref 0.0–0.2)

## 2022-07-16 LAB — BLOOD GAS, VENOUS
Acid-base deficit: 6.7 mmol/L — ABNORMAL HIGH (ref 0.0–2.0)
Bicarbonate: 20.6 mmol/L (ref 20.0–28.0)
O2 Saturation: 17 %
Patient temperature: 37
pCO2, Ven: 47 mmHg (ref 44–60)
pH, Ven: 7.25 (ref 7.25–7.43)
pO2, Ven: <31 mmHg — CL (ref 32–45)

## 2022-07-16 LAB — GLUCOSE, CAPILLARY: Glucose-Capillary: 131 mg/dL — ABNORMAL HIGH (ref 70–99)

## 2022-07-16 LAB — COMPREHENSIVE METABOLIC PANEL
ALT: <5 U/L (ref 0–44)
AST: 78 U/L — ABNORMAL HIGH (ref 15–41)
Albumin: 2.7 g/dL — ABNORMAL LOW (ref 3.5–5.0)
Alkaline Phosphatase: 63 U/L (ref 38–126)
Anion gap: 20 — ABNORMAL HIGH (ref 5–15)
BUN: 87 mg/dL — ABNORMAL HIGH (ref 8–23)
CO2: 21 mmol/L — ABNORMAL LOW (ref 22–32)
Calcium: 9.1 mg/dL (ref 8.9–10.3)
Chloride: 126 mmol/L — ABNORMAL HIGH (ref 98–111)
Creatinine, Ser: 4.32 mg/dL — ABNORMAL HIGH (ref 0.61–1.24)
GFR, Estimated: 15 mL/min — ABNORMAL LOW (ref >60)
Glucose, Bld: 139 mg/dL — ABNORMAL HIGH (ref 70–99)
Potassium: 3.8 mmol/L (ref 3.5–5.1)
Sodium: 167 mmol/L (ref 135–145)
Total Bilirubin: 0.9 mg/dL (ref 0.3–1.2)
Total Protein: 7.4 g/dL (ref 6.5–8.1)

## 2022-07-16 LAB — RESP PANEL BY RT-PCR (FLU A&B, COVID) ARPGX2
Influenza A by PCR: NEGATIVE
Influenza B by PCR: NEGATIVE
SARS Coronavirus 2 by RT PCR: NEGATIVE

## 2022-07-16 LAB — TYPE AND SCREEN
ABO/RH(D): O POS
Antibody Screen: NEGATIVE

## 2022-07-16 LAB — BRAIN NATRIURETIC PEPTIDE: B Natriuretic Peptide: 144.4 pg/mL — ABNORMAL HIGH (ref 0.0–100.0)

## 2022-07-16 LAB — PROTIME-INR
INR: 1.4 — ABNORMAL HIGH (ref 0.8–1.2)
Prothrombin Time: 17 seconds — ABNORMAL HIGH (ref 11.4–15.2)

## 2022-07-16 LAB — LIPASE, BLOOD: Lipase: 43 U/L (ref 11–51)

## 2022-07-16 MED ORDER — ACETAMINOPHEN 325 MG PO TABS
650.0000 mg | ORAL_TABLET | Freq: Four times a day (QID) | ORAL | Status: DC | PRN
  Administered 2022-07-18 – 2022-07-20 (×4): 650 mg via ORAL
  Filled 2022-07-16 (×4): qty 2

## 2022-07-16 MED ORDER — LORAZEPAM 2 MG/ML IJ SOLN
2.0000 mg | Freq: Once | INTRAMUSCULAR | Status: AC
  Administered 2022-07-16: 2 mg via INTRAVENOUS
  Filled 2022-07-16: qty 1

## 2022-07-16 MED ORDER — ONDANSETRON HCL 4 MG/2ML IJ SOLN: 4.0000 mg | Freq: Four times a day (QID) | INTRAMUSCULAR | Status: DC | PRN

## 2022-07-16 MED ORDER — VANCOMYCIN HCL 1250 MG/250ML IV SOLN
1250.0000 mg | Freq: Once | INTRAVENOUS | Status: AC
  Administered 2022-07-16: 1250 mg via INTRAVENOUS
  Filled 2022-07-16: qty 250

## 2022-07-16 MED ORDER — HEPARIN SODIUM (PORCINE) 5000 UNIT/ML IJ SOLN
5000.0000 [IU] | Freq: Three times a day (TID) | INTRAMUSCULAR | Status: DC
  Administered 2022-07-16 – 2022-07-20 (×10): 5000 [IU] via SUBCUTANEOUS
  Filled 2022-07-16 (×10): qty 1

## 2022-07-16 MED ORDER — VANCOMYCIN VARIABLE DOSE PER UNSTABLE RENAL FUNCTION (PHARMACIST DOSING): Status: DC

## 2022-07-16 MED ORDER — FENTANYL CITRATE PF 50 MCG/ML IJ SOSY: 50.0000 ug | PREFILLED_SYRINGE | Freq: Once | INTRAMUSCULAR | Status: DC

## 2022-07-16 MED ORDER — ONDANSETRON HCL 4 MG PO TABS: 4.0000 mg | ORAL_TABLET | Freq: Four times a day (QID) | ORAL | Status: DC | PRN

## 2022-07-16 MED ORDER — ACETAMINOPHEN 650 MG RE SUPP
650.0000 mg | Freq: Once | RECTAL | Status: AC
  Administered 2022-07-16: 650 mg via RECTAL
  Filled 2022-07-16: qty 1

## 2022-07-16 MED ORDER — SODIUM CHLORIDE 0.9 % IV BOLUS
3000.0000 mL | Freq: Once | INTRAVENOUS | Status: AC
  Administered 2022-07-16: 3000 mL via INTRAVENOUS

## 2022-07-16 MED ORDER — LACTATED RINGERS IV BOLUS
30.0000 mL/kg | Freq: Once | INTRAVENOUS | Status: AC
  Administered 2022-07-16: 1800 mL via INTRAVENOUS

## 2022-07-16 MED ORDER — DEXTROSE 5 % IV SOLN: INTRAVENOUS | Status: DC

## 2022-07-16 MED ORDER — HALOPERIDOL LACTATE 5 MG/ML IJ SOLN: 3.0000 mg | Freq: Four times a day (QID) | INTRAMUSCULAR | Status: DC | PRN

## 2022-07-16 MED ORDER — ONDANSETRON HCL 4 MG/2ML IJ SOLN
4.0000 mg | Freq: Once | INTRAMUSCULAR | Status: AC
  Administered 2022-07-16: 4 mg via INTRAVENOUS
  Filled 2022-07-16: qty 2

## 2022-07-16 MED ORDER — ACETAMINOPHEN 650 MG RE SUPP: 650.0000 mg | Freq: Four times a day (QID) | RECTAL | Status: DC | PRN

## 2022-07-16 MED ORDER — ACETAMINOPHEN 325 MG RE SUPP
325.0000 mg | Freq: Once | RECTAL | Status: AC
  Administered 2022-07-16: 325 mg via RECTAL
  Filled 2022-07-16: qty 1

## 2022-07-16 MED ORDER — SODIUM CHLORIDE 0.9 % IV SOLN
2.0000 g | INTRAVENOUS | Status: DC
  Administered 2022-07-16 – 2022-07-17 (×2): 2 g via INTRAVENOUS
  Filled 2022-07-16 (×2): qty 12.5

## 2022-07-16 MED ORDER — PIPERACILLIN-TAZOBACTAM 3.375 G IVPB 30 MIN
3.3750 g | Freq: Once | INTRAVENOUS | Status: AC
  Administered 2022-07-16: 3.375 g via INTRAVENOUS
  Filled 2022-07-16: qty 50

## 2022-07-16 NOTE — H&P (Addendum)
History and Physical    Patient: [name] [id number] DOB: 1956-08-08 DOA: 07/16/2022 DOS: the patient was seen and examined on 07/16/2022 PCP: Jonathon Jordan, MD  Patient coming from: Home  Chief Complaint:  Chief Complaint  Patient presents with   Altered Mental Status   HPI: [name] is a 66 y.o. male with medical history significant of advanced early Alzheimer's dementia, anxiety, hypertension, hyperlipidemia who is brought from his facility due to fever for the past 2 days associated with decreased level of consciousness.  He was febrile earlier.  He was given lorazepam 2 mg IVP.  He is unable to provide any history.  ED course: 104.2 F, pulse 129, respirations 40, BP 90/63 mmHg and O2 sat 97% on room air.  The patient received 2 mg of lorazepam, 975 mg of acetaminophen per rectum, LR 1800 mL bolus, normal saline 2000 mL bolus, ondansetron 4 mg IVP, vancomycin and Zosyn.  Lab work: Venous blood gas showed decreased PO2 and a 6.7 minimal acid-base deficit.  Urinalysis was hazy with ketonuria 5 and proteinuria 30 mg/dL.  Normal lipase.  The rest of the measurements were normal.  CBC with a white count of 17.0, hemoglobin 14.1 g/dL and platelets 241.  BNP 844 pg/mL.  CMP showed sodium 167, potassium 3.8, chloride 126 and CO2 21 mmol/L with an anion gap of 20.  Glucose 139, BUN 87 creatinine 4.32 mg/dL.  Albumin was 2.7 g/deciliter and AST 78.  The rest of the CMP measurements were normal.  Imaging: Portable 1 view chest radiograph with left lower lobe retrocardiac airspace may represent pneumonia.  Similar appearance of chronic scaring versus atelectasis in the lingula associated with elevation of the left hemidiaphragm.  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  No drainable abscess observed.  Left lower lobe infiltrate and small left pleural effusion.  There is mucus in the trachea and  both mainstem bronchi nearly completely filling the bronchus intermedius and plugging the right lower lobe bronchi.  Prominent stool throughout the colon.  Aortic and coronary atherosclerosis.   Review of Systems: As mentioned in the history of present illness. All other systems reviewed and are negative.  Past Medical History:  Diagnosis Date   Alzheimer's dementia ([place])    Anxiety    Hypercholesterolemia    Hypertension    History reviewed. No pertinent surgical history. Social History:  reports that he has never smoked. He has never used smokeless tobacco. He reports that he does not drink alcohol and does not use drugs.  No Known Allergies  History reviewed. No pertinent family history.  Prior to Admission medications   Medication Sig Start Date End Date Taking? Authorizing Provider  Amino Acids-Protein Hydrolys (PRO-STAT PO) Take 30 mLs by mouth 2 (two) times daily.   Yes [provider]  ascorbic acid (VITAMIN C) 500 MG tablet Take 500 mg by mouth daily.   Yes [provider]  cefTRIAXone (ROCEPHIN) 1 g injection Inject 1 g into the muscle See admin instructions. Once daily for 3 days.  07/16/22 Yes [provider]  Cholecalciferol 25 MCG (1000 UT) capsule Take 1,000 Units by mouth daily.   Yes [provider]  divalproex (DEPAKOTE) 125 MG DR tablet Take 125 mg by mouth 2 (two) times daily.   Yes [provider]  memantine (NAMENDA) 10 MG tablet Take 10 mg by mouth 2 (two) times daily.   Yes [provider]  metoprolol  succinate (TOPROL-XL) 25 MG 24 hr tablet Take 25 mg by mouth daily. 06/06/22  Yes [provider]  Multiple Vitamins-Minerals (MULTIVITAL PO) Take 1 tablet by mouth daily.   Yes [provider]  vitamin B-12 (CYANOCOBALAMIN) 1000 MCG tablet Take 1,000 mcg by mouth daily.   Yes [provider]  ZINC OXIDE EX Apply 1 Application topically 2 (two) times daily.   Yes [provider]   zinc sulfate 220 (50 Zn) MG capsule Take 220 mg by mouth daily.   Yes [provider]    Physical Exam: Vitals:   07/16/22 1145 07/16/22 1247 07/16/22 1415 07/16/22 1529  BP: 90/63 (!) 145/133 (!) 72/57   Pulse: (!) 128 (!) 120 (!) 52   Resp: (!) 33 (!) 26 20   Temp:    (!) 100.6 F (38.1 C)  TempSrc:    Rectal  SpO2: 97% 99% (!) 83%    Physical Exam Vitals and nursing note reviewed.  Constitutional:      Appearance: Normal appearance.  HENT:     Head: Normocephalic.     Nose: No rhinorrhea.     Mouth/Throat:     Mouth: Mucous membranes are dry.  Eyes:     General: No scleral icterus.    Pupils: Pupils are equal, round, and reactive to light.  Neck:     Vascular: No JVD.  Cardiovascular:     Rate and Rhythm: Normal rate and regular rhythm.     Heart sounds: S1 normal and S2 normal.  Pulmonary:     Effort: Tachypnea present. No accessory muscle usage.     Breath sounds: Examination of the right-lower field reveals decreased breath sounds. Examination of the left-lower field reveals decreased breath sounds. Decreased breath sounds present. No wheezing, rhonchi or rales.  Abdominal:     General: Bowel sounds are normal.     Palpations: Abdomen is soft.  Musculoskeletal:     Cervical back: Neck supple.     Right lower leg: No edema.     Left lower leg: No edema.  Skin:    General: Skin is warm and dry.     Comments: 5x5 CM is stage IV sacral ulcer.  Please see images below.  Neurological:     Mental Status: He is alert. He is disoriented.          Data Reviewed:  Results are pending, will review when available.  Assessment and Plan: Principal Problem:   Sepsis due to undetermined organism ([place]) In the setting of:   Stage IV pressure ulcer of sacral region [facility])   Questionable pneumonia on imaging as well. Admit to PCU/inpatient. Continue IV fluids. Begin cefepime per pharmacy. Continue vancomycin per pharmacy. Follow-up blood culture and  sensitivity Follow CBC and CMP in a.m. Consult wound and ostomy care.  Active Problems:   Hypernatremia Free water deficit is 6 L. Dextrose 5% 165 mL/h. Monitor CBG every 6 hours.    AKI (acute kidney injury) ([place]) Continue IV fluids. Avoid hypotension. Avoid nephrotoxins. Monitor intake and output. Monitor renal function electrolytes. Nephrology consult appreciated.    Leukocytosis In the setting of infection/hemoconcentration. Follow-up WBC level in the morning.    GAD (generalized anxiety disorder)   Alzheimer's dementia ([place]) Currently NPO. Continue current treatment. Resume regular meds once more alert.    Advance Care Planning:   Code Status: DNR.  Consults: Speech/swallow pathology and wound care.  Family Communication: His wife and sister-in-law were present in the ED room.  Severity of Illness: The appropriate patient status for this patient is INPATIENT. Inpatient status is judged to be reasonable and necessary in order to provide the required intensity of service to ensure the patient's safety. The patient's presenting symptoms, physical exam findings, and initial radiographic and laboratory data in the context of their chronic comorbidities is felt to place them at high risk for further clinical deterioration. Furthermore, it is not anticipated that the patient will be medically stable for discharge from the hospital within 2 midnights of admission.   * I certify that at the point of admission it is my clinical judgment that the patient will require inpatient hospital care spanning beyond 2 midnights from the point of admission due to high intensity of service, high risk for further deterioration and high frequency of surveillance required.*  Author: Reubin Milan, MD 07/16/2022 3:38 PM  For on call review [email or web address].   This document was prepared using Dragon voice recognition software and may contain some unintended transcription errors.

## 2022-07-16 NOTE — ED Triage Notes (Signed)
[facility] brings pt in from [facility]. Staff states pt has had a decrease in LOC over the past week. Staff states pt would not swallow this morning. Also reports WBC was elevated this week.

## 2022-07-16 NOTE — Progress Notes (Signed)
Pts wife stated she would like a palliative consult to discuss goals of care

## 2022-07-16 NOTE — ED Provider Notes (Signed)
[facility] DEPT Provider Note  CSN: [id number] Arrival date & time: 07/16/22 1113  Chief Complaint(s) Altered Mental Status  HPI [name] is a 66 y.o. male with history of Alzheimer's dementia, hyperlipidemia, hypertension presenting with decreased responsiveness.  Patient's baseline is usually tracking, swallowing.  He is DNR.  He has a sacral ulcer.  They noticed over the past week that he has been decreased responsiveness and today would not swallow foods.  Apparently reported to have a very elevated WBC count earlier.  History otherwise limited due to dementia, acuity of condition   Past Medical History Past Medical History:  Diagnosis Date   Alzheimer's dementia ([place])    Anxiety    Hypercholesterolemia    Hypertension    Patient Active Problem List   Diagnosis Date Noted   Sepsis due to undetermined organism ([place]) 07/16/2022   Moderate major neurocognitive disorder due to probable Alzheimer's disease, without behavioral disturbance ([place]) 07/03/2018   GAD (generalized anxiety disorder) 06/22/2018   Home Medication(s) Prior to Admission medications   Medication Sig Start Date End Date Taking? Authorizing Provider  Amino Acids-Protein Hydrolys (PRO-STAT PO) Take 30 mLs by mouth 2 (two) times daily.   Yes [provider]  ascorbic acid (VITAMIN C) 500 MG tablet Take 500 mg by mouth daily.   Yes [provider]  cefTRIAXone (ROCEPHIN) 1 g injection Inject 1 g into the muscle See admin instructions. Once daily for 3 days.  07/16/22 Yes [provider]  Cholecalciferol 25 MCG (1000 UT) capsule Take 1,000 Units by mouth daily.   Yes [provider]  divalproex (DEPAKOTE) 125 MG DR tablet Take 125 mg by mouth 2 (two) times daily.   Yes [provider]  memantine (NAMENDA) 10 MG tablet Take 10 mg by mouth 2 (two) times daily.   Yes [provider]  metoprolol succinate (TOPROL-XL) 25 MG 24 hr  tablet Take 25 mg by mouth daily. 06/06/22  Yes [provider]  Multiple Vitamins-Minerals (MULTIVITAL PO) Take 1 tablet by mouth daily.   Yes [provider]  vitamin B-12 (CYANOCOBALAMIN) 1000 MCG tablet Take 1,000 mcg by mouth daily.   Yes [provider]  ZINC OXIDE EX Apply 1 Application topically 2 (two) times daily.   Yes [provider]  zinc sulfate 220 (50 Zn) MG capsule Take 220 mg by mouth daily.   Yes [provider]                                                                                                                                    Past Surgical History History reviewed. No pertinent surgical history. Family History Family History  Problem Relation Age of Onset   Alzheimer's disease Mother    Alzheimer's disease Brother    Alzheimer's disease Maternal Aunt     Social History Social History   Tobacco Use   Smoking status:  Never   Smokeless tobacco: Never  Vaping Use   Vaping Use: Never used  Substance Use Topics   Alcohol use: No   Drug use: No   Allergies Patient has no known allergies.  Review of Systems Review of Systems  Unable to perform ROS: Patient nonverbal    Physical Exam Vital Signs  I have reviewed the triage vital signs BP 101/68   Pulse (!) 105   Temp (!) 100.6 F (38.1 C) (Rectal)   Resp (!) 32   Wt 62.1 kg   SpO2 96%   BMI 22.10 kg/m  Physical Exam Constitutional:      General: He is in acute distress.     Appearance: He is ill-appearing. He is not toxic-appearing.  HENT:     Head: Normocephalic and atraumatic.     Right Ear: External ear normal.     Left Ear: External ear normal.     Mouth/Throat:     Mouth: Mucous membranes are dry.  Eyes:     Conjunctiva/sclera: Conjunctivae normal.  Cardiovascular:     Rate and Rhythm: Regular rhythm. Tachycardia present.     Heart sounds: No murmur heard. Pulmonary:     Comments: Tachypnea, coarse lung sounds  bilaterally Abdominal:     General: Abdomen is flat.     Tenderness: There is abdominal tenderness (diffuse).  Genitourinary:    Comments: Chaperoned by nursing, large stage IV sacral decubitus ulcer, with surrounding erythema and warmth, no active drainage Musculoskeletal:     Right lower leg: No edema.     Left lower leg: No edema.  Skin:    General: Skin is dry.     Capillary Refill: Capillary refill takes 2 to 3 seconds.  Neurological:     Mental Status: Mental status is at baseline.  Psychiatric:     Comments: Unable to assess     ED Results and Treatments Labs (all labs ordered are listed, but only abnormal results are displayed) Labs Reviewed  COMPREHENSIVE METABOLIC PANEL - Abnormal; Notable for the following components:      Result Value   Sodium 167 (*)    Chloride 126 (*)    CO2 21 (*)    Glucose, Bld 139 (*)    BUN 87 (*)    Creatinine, Ser 4.32 (*)    Albumin 2.7 (*)    AST 78 (*)    GFR, Estimated 15 (*)    Anion gap 20 (*)    All other components within normal limits  URINALYSIS, ROUTINE W REFLEX MICROSCOPIC - Abnormal; Notable for the following components:   Color, Urine AMBER (*)    APPearance HAZY (*)    Ketones, ur 5 (*)    Protein, ur 30 (*)    All other components within normal limits  CBC WITH DIFFERENTIAL/PLATELET - Abnormal; Notable for the following components:   WBC 17.0 (*)    MCHC 29.6 (*)    nRBC 0.7 (*)    Neutro Abs 14.4 (*)    Monocytes Absolute 1.1 (*)    Abs Immature Granulocytes 0.22 (*)    All other components within normal limits  PROTIME-INR - Abnormal; Notable for the following components:   Prothrombin Time 17.0 (*)    INR 1.4 (*)    All other components within normal limits  BRAIN NATRIURETIC PEPTIDE - Abnormal; Notable for the following components:   B Natriuretic Peptide 144.4 (*)    All other components within normal limits  BLOOD GAS, VENOUS -  Abnormal; Notable for the following components:   pO2, Ven <31 (*)     Acid-base deficit 6.7 (*)    All other components within normal limits  CULTURE, BLOOD (ROUTINE X 2)  CULTURE, BLOOD (ROUTINE X 2)  URINE CULTURE  RESP PANEL BY RT-PCR (FLU A&B, COVID) ARPGX2  LIPASE, BLOOD  I-STAT CHEM 8, ED  TYPE AND SCREEN  ABO/RH                                                                                                                          Radiology CT CHEST ABDOMEN PELVIS WO CONTRAST  Result Date: 07/16/2022 CLINICAL DATA:  Sepsis.  Dementia. EXAM: CT CHEST, ABDOMEN AND PELVIS WITHOUT CONTRAST TECHNIQUE: Multidetector CT imaging of the chest, abdomen and pelvis was performed following the standard protocol without IV contrast. RADIATION DOSE REDUCTION: This exam was performed according to the departmental dose-optimization program which includes automated exposure control, adjustment of the mA and/or kV according to patient size and/or use of iterative reconstruction technique. COMPARISON:  Chest radiograph 07/16/2022 FINDINGS: Despite efforts by the technologist and patient, motion artifact is present on today's exam and could not be eliminated. This reduces exam sensitivity and specificity. CT CHEST FINDINGS Cardiovascular: Left anterior descending and right coronary artery atherosclerotic calcification along with thoracic aortic atherosclerotic calcification. Mediastinum/Nodes: Unremarkable Lungs/Pleura: Left lower lobe airspace opacity and small left pleural effusion. Left lower lobe pneumonia is a distinct possibility. Atelectasis and minimal posteromedial airspace opacity peripherally in the right lower lobe, cannot exclude a component of right lower lobe pneumonia. There is mucus in the trachea and both mainstem bronchi, nearly completely filling the bronchus intermedius and plugging the right lower lobe bronchi. Musculoskeletal: Healed deformities of left posteromedial ribs compatible with old healed rib fractures. CT ABDOMEN PELVIS FINDINGS Hepatobiliary:  Scattered fluid density lesions in the left hepatic lobe to a lesser degree in the right hepatic lobe, likely cysts. Index lesion in segment 4 of the liver measures 3.2 by 1.9 cm. Gallbladder unremarkable. Pancreas: Unremarkable Spleen: Unremarkable Adrenals/Urinary Tract: Foley catheter in the otherwise empty urinary bladder. The kidneys and adrenal glands appear normal. Stomach/Bowel: Prominent stool throughout the colon favors constipation. Moderate prominence of stool in the rectal vault, no perirectal stranding to suggest stercoral colitis. Vascular/Lymphatic: Atherosclerosis is present, including aortoiliac atherosclerotic disease. No pathologic adenopathy observed. Reproductive: Unremarkable Other: No supplemental non-categorized findings. Musculoskeletal: Sacrococcygeal decubitus ulcer with abdomen sphere gas or soft tissue gas extending to the periosteal margin of the posterior coccyx as on images 114 through 119 of series 5. There is also a small amount of abnormal pre coccygeal gas as well as presacral and pre coccygeal edema favoring coccygeal osteomyelitis and local infection. No drainable abscess observed. 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 IMPRESSION: 1. 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 No drainable abscess observed. 2. Left lower lobe airspace opacity and small left pleural effusion. Left  lower lobe pneumonia is a distinct possibility. There is mucus in the trachea and both mainstem bronchi, nearly completely filling the bronchus intermedius and plugging the right lower lobe bronchi. There is also some atelectasis and minimal posteromedial airspace opacity in the right lower lobe, cannot exclude a component of right lower lobe pneumonia. 3. Prominent stool throughout the colon favors constipation. Moderate prominence of stool in the rectal vault, no perirectal stranding to suggest stercoral  colitis. 4. 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 5. Aortic and coronary atherosclerosis. Aortic Atherosclerosis (ICD10-I70.0). Electronically Signed   By: Gaylyn RongWalter  Liebkemann M.D.   On: 07/16/2022 14:13   DG Chest Port 1 View  Result Date: 07/16/2022 CLINICAL DATA:  Sepsis EXAM: PORTABLE CHEST 1 VIEW COMPARISON:  Prior chest x-ray 06/15/2021 FINDINGS: Low inspiratory volumes. Airspace opacity in the left retrocardiac region obscures the medial hemidiaphragm. Chronic atelectasis versus scarring in the lingula appears similar. The remaining lungs are clear. Cardiac and mediastinal contours are within normal limits. No pneumothorax. No acute osseous abnormality. Chronic elevation of the left hemidiaphragm anteriorly. IMPRESSION: 1. Left lower lobe retrocardiac airspace opacity may represent pneumonia in the setting of sepsis. 2. Similar appearance of chronic scarring versus atelectasis in the lingula with associated elevation of the left hemidiaphragm. Electronically Signed   By: Malachy MoanHeath  McCullough M.D.   On: 07/16/2022 12:07    Pertinent labs & imaging results that were available during my care of the patient were reviewed by me and considered in my medical decision making (see MDM for details).  Medications Ordered in ED Medications  acetaminophen (TYLENOL) suppository 325 mg (has no administration in time range)  dextrose 5 % solution (has no administration in time range)  heparin injection 5,000 Units (has no administration in time range)  acetaminophen (TYLENOL) tablet 650 mg (has no administration in time range)    Or  acetaminophen (TYLENOL) suppository 650 mg (has no administration in time range)  ondansetron (ZOFRAN) tablet 4 mg (has no administration in time range)    Or  ondansetron (ZOFRAN) injection 4 mg (has no administration in time range)  sodium chloride 0.9 % bolus 3,000 mL (has no administration in time range)  acetaminophen  (TYLENOL) suppository 650 mg (650 mg Rectal Given 07/16/22 1201)  piperacillin-tazobactam (ZOSYN) IVPB 3.375 g (0 g Intravenous Stopped 07/16/22 1234)  lactated ringers bolus 1,800 mL (1,800 mLs Intravenous New Bag/Given 07/16/22 1155)  vancomycin (VANCOREADY) IVPB 1250 mg/250 mL (1,250 mg Intravenous New Bag/Given 07/16/22 1350)  ondansetron (ZOFRAN) injection 4 mg (4 mg Intravenous Given 07/16/22 1516)  LORazepam (ATIVAN) injection 2 mg (2 mg Intravenous Given 07/16/22 1519)                                                                                                                                     Procedures .Critical Care  Performed by: Lonell GrandchildScheving, Jennfier Abdulla L,  MD Authorized by: Lonell Grandchild, MD   Critical care provider statement:    Critical care time (minutes):  75   Critical care time was exclusive of:  Separately billable procedures and treating other patients   Critical care was necessary to treat or prevent imminent or life-threatening deterioration of the following conditions:  Sepsis, shock, circulatory failure and dehydration   Critical care was time spent personally by me on the following activities:  Development of treatment plan with patient or surrogate, discussions with consultants, evaluation of patient's response to treatment, examination of patient, ordering and review of laboratory studies, ordering and review of radiographic studies, ordering and performing treatments and interventions, pulse oximetry, re-evaluation of patient's condition, review of old charts and obtaining history from patient or surrogate   Care discussed with: admitting provider     (including critical care time)  Medical Decision Making / ED Course   MDM:  66 year old male with dementia presenting with decreased responsiveness.  Vital signs notable for significant tachycardia, fever, tachypnea.  Patient septic appearing.   Differential includes pneumonia, infected sacral decubitus ulcer,  intra-abdominal infection such as appendicitis, cholecystitis, urinary infection.  No obvious other skin infection besides site of sacral ulcer.  Lower concern for meningitis given focal infectious area on exam.  Given critical illness we will give IV fluid bolus, IV blood cultures, give IV antibiotics, and obtain imaging of the chest and abdomen to further evaluate for alternative sources of infection.  Patient will likely need to be admitted given signs of severe sepsis  Clinical Course as of 07/16/22 1652  Fri Jul 16, 2022  1317 Discussed with Dr. Arta Silence who will follow along and consult for nephrology [WS]  1525 Admitted to medicine, discussed with Dr. Robb Matar.  Discussed results of aspiration pneumonia and coccygeal osteomyelitis.  Patient is DNR/DNI.  He has severe Alzheimer's dementia.  Discussed guarded prognosis with the family.  We will continue supportive care with antibiotics, fluids.  Most recent documented set of vital signs is inaccurate.  [WS]    Clinical Course User Index [WS] Lonell Grandchild, MD     Additional history obtained: -Additional history obtained from family, ems, and spouse -External records from outside source obtained and reviewed including: Chart review including previous notes, labs, imaging, consultation notes including ED note 09/30/2021   Lab Tests: -I ordered, reviewed, and interpreted labs.   The pertinent results include:   Labs Reviewed  COMPREHENSIVE METABOLIC PANEL - Abnormal; Notable for the following components:      Result Value   Sodium 167 (*)    Chloride 126 (*)    CO2 21 (*)    Glucose, Bld 139 (*)    BUN 87 (*)    Creatinine, Ser 4.32 (*)    Albumin 2.7 (*)    AST 78 (*)    GFR, Estimated 15 (*)    Anion gap 20 (*)    All other components within normal limits  URINALYSIS, ROUTINE W REFLEX MICROSCOPIC - Abnormal; Notable for the following components:   Color, Urine AMBER (*)    APPearance HAZY (*)    Ketones, ur 5 (*)    Protein,  ur 30 (*)    All other components within normal limits  CBC WITH DIFFERENTIAL/PLATELET - Abnormal; Notable for the following components:   WBC 17.0 (*)    MCHC 29.6 (*)    nRBC 0.7 (*)    Neutro Abs 14.4 (*)    Monocytes Absolute 1.1 (*)  Abs Immature Granulocytes 0.22 (*)    All other components within normal limits  PROTIME-INR - Abnormal; Notable for the following components:   Prothrombin Time 17.0 (*)    INR 1.4 (*)    All other components within normal limits  BRAIN NATRIURETIC PEPTIDE - Abnormal; Notable for the following components:   B Natriuretic Peptide 144.4 (*)    All other components within normal limits  BLOOD GAS, VENOUS - Abnormal; Notable for the following components:   pO2, Ven <31 (*)    Acid-base deficit 6.7 (*)    All other components within normal limits  CULTURE, BLOOD (ROUTINE X 2)  CULTURE, BLOOD (ROUTINE X 2)  URINE CULTURE  RESP PANEL BY RT-PCR (FLU A&B, COVID) ARPGX2  LIPASE, BLOOD  I-STAT CHEM 8, ED  TYPE AND SCREEN  ABO/RH    Notable for severe hyponatremia, leukocytosis with bandemia, acute kidney injury  EKG   EKG Interpretation  Date/Time:  Friday July 16 2022 14:13:19 EDT Ventricular Rate:  118 PR Interval:    QRS Duration: 145 QT Interval:  353 QTC Calculation: 495 R Axis:   -6 Text Interpretation: Undetermined rhythm Interpretation limited secondary to artifact Confirmed by Alvino Blood ([id number]) on 07/16/2022 2:27:08 PM         Imaging Studies ordered: I ordered imaging studies including CT chest abdomen and pelvis On my interpretation imaging demonstrates likely aspiration pneumonia, likely coccygeal osteomyelitis I independently visualized and interpreted imaging. I agree with the radiologist interpretation   Medicines ordered and prescription drug management: Meds ordered this encounter  Medications   acetaminophen (TYLENOL) suppository 650 mg   piperacillin-tazobactam (ZOSYN) IVPB 3.375 g   lactated ringers  bolus 1,800 mL   vancomycin (VANCOREADY) IVPB 1250 mg/250 mL    Order Specific Question:   Indication:    Answer:   Sepsis   DISCONTD: fentaNYL (SUBLIMAZE) injection 50 mcg   ondansetron (ZOFRAN) injection 4 mg   acetaminophen (TYLENOL) suppository 325 mg   LORazepam (ATIVAN) injection 2 mg   dextrose 5 % solution   heparin injection 5,000 Units   OR Linked Order Group    acetaminophen (TYLENOL) tablet 650 mg    acetaminophen (TYLENOL) suppository 650 mg   OR Linked Order Group    ondansetron (ZOFRAN) tablet 4 mg    ondansetron (ZOFRAN) injection 4 mg   sodium chloride 0.9 % bolus 3,000 mL    -I have reviewed the patients home medicines and have made adjustments as needed   Consultations Obtained: I requested consultation with the nephrologist,  and discussed lab and imaging findings as well as pertinent plan - they recommend: Aggressive IV hydration   Cardiac Monitoring: The patient was maintained on a cardiac monitor.  I personally viewed and interpreted the cardiac monitored which showed an underlying rhythm of: Sinus tachycardia  Social Determinants of Health:  Diagnosis or treatment significantly limited by social determinants of health: Nonverbal   Reevaluation: After the interventions noted above, I reevaluated the patient and found that they have improved  Co morbidities that complicate the patient evaluation  Past Medical History:  Diagnosis Date   Alzheimer's dementia (HCC)    Anxiety    Hypercholesterolemia    Hypertension       Dispostion: Disposition decision including need for hospitalization was considered, and patient admitted to the hospital.    Final Clinical Impression(s) / ED Diagnoses Final diagnoses:  Sepsis, due to unspecified organism, unspecified whether acute organ dysfunction present (HCC)  Stage IV pressure ulcer  of sacral region [facility])  Osteomyelitis of coccyx (HCC)  Aspiration pneumonia of both lower lobes, unspecified aspiration  pneumonia type [facility])     This chart was dictated using voice recognition software.  Despite best efforts to proofread,  errors can occur which can change the documentation meaning.    Lonell Grandchild, MD 07/16/22 [phone]

## 2022-07-16 NOTE — Consult Note (Signed)
Renal Service Consult Note [facility]  [name] 07/16/2022 [name], MD Requesting Physician: Dr. Olevia Bowens  Reason for Consult: Acute renal faliure HPI: The patient is a 66 y.o. year-old w/ PMH as below presenting from SNF w/ decline in mental status over the past week. Could not swallow this am. Pt baseline is tracking, bedbound, nonverbal, eats well though. Pt is DNR, has a sacral ulcer. In ED creat 4, WBC 17K, CT showed sacral decubitus w/ osteo, and LLL pna. Na was ~159. Pt started on IV abx. BP's soft in ED. Rec'd 1800 cc LR bolus and started on D5W at 200 cc/hr. Asked to see for renal failure.   Pt seen in room, hx of early dementia started 9 yrs ago. Is now in a SNF, bedbound, is able to eat, tracks usually, doesn't speak. Family provided this history.   ROS - n/a   Past Medical History  Past Medical History:  Diagnosis Date   Alzheimer's dementia ([place])    Anxiety    Hypercholesterolemia    Hypertension    Past Surgical History History reviewed. No pertinent surgical history. Family History  Family History  Problem Relation Age of Onset   Alzheimer's disease Mother    Alzheimer's disease Brother    Alzheimer's disease Maternal Aunt    Social History  reports that he has never smoked. He has never used smokeless tobacco. He reports that he does not drink alcohol and does not use drugs. Allergies No Known Allergies Home medications Prior to Admission medications   Medication Sig Start Date End Date Taking? Authorizing Provider  Amino Acids-Protein Hydrolys (PRO-STAT PO) Take 30 mLs by mouth 2 (two) times daily.   Yes [provider]  ascorbic acid (VITAMIN C) 500 MG tablet Take 500 mg by mouth daily.   Yes [provider]  cefTRIAXone (ROCEPHIN) 1 g injection Inject 1 g into the muscle See admin instructions. Once daily for 3 days.  07/16/22 Yes [provider]  Cholecalciferol 25 MCG (1000 UT) capsule Take 1,000 Units  by mouth daily.   Yes [provider]  divalproex (DEPAKOTE) 125 MG DR tablet Take 125 mg by mouth 2 (two) times daily.   Yes [provider]  memantine (NAMENDA) 10 MG tablet Take 10 mg by mouth 2 (two) times daily.   Yes [provider]  metoprolol succinate (TOPROL-XL) 25 MG 24 hr tablet Take 25 mg by mouth daily. 06/06/22  Yes [provider]  Multiple Vitamins-Minerals (MULTIVITAL PO) Take 1 tablet by mouth daily.   Yes [provider]  vitamin B-12 (CYANOCOBALAMIN) 1000 MCG tablet Take 1,000 mcg by mouth daily.   Yes [provider]  ZINC OXIDE EX Apply 1 Application topically 2 (two) times daily.   Yes [provider]  zinc sulfate 220 (50 Zn) MG capsule Take 220 mg by mouth daily.   Yes [provider]     Vitals:   07/16/22 1529 07/16/22 1530 07/16/22 1544 07/16/22 1545  BP:  100/73  101/68  Pulse:  (!) 108  (!) 105  Resp:  (!) 22  (!) 32  Temp: (!) 100.6 F (38.1 C)     TempSrc: Rectal     SpO2:  96%  96%  Weight:   62.1 kg    Exam Gen confused, twitching of UE's > LE's, does moan Cachectic  No rash, cyanosis or gangrene Sclera anicteric, throat quite dry  No jvd or bruits, flat neck veins Chest  clear bilat to bases, no rales/ wheezing RRR no MRG Abd soft ntnd no mass or ascites +bs GU normal male MS possible LE > UE contractures Ext no LE or UE edema Neuro as above    Home meds include - depakote, memantine, metoprolol xl 25, mVI, vits/ supps/ prns     ED - 1800 LR, 200 cc/hr  D5W, IV zosyn/ vanc    BP's low 70s - 100s in ED, HR 112, RR 20-32     Temp 104  Assessment/ Plan: AKI - b/l creat 0.8 from Oct 2022, eGFR > 60. Creat here is 4.2 in setting of poor po intake recently, sepsis/ hypotension, infected sacral decub w/ osteo and possibly PNA. Pt looks quite dry on exam. Na+ is high. Pt is DNR. Plan is for IVF"s w/ NS for vol depletion, and D5W at 165 cc/hr for hypernatremia (hypovolemic). Get  UA, renal US, urine lytes. Pt is not a HD candidate. Will follow.  Sacral decub/ osteo LLL pna Dementia - bedbound at baseline  HTN - hold home meds for now      Kelly Splinter  MD 07/16/2022, 4:25 PM Recent Labs  Lab 07/16/22 1126  HGB 14.1  ALBUMIN 2.7*  CALCIUM 9.1  CREATININE 4.32*  K 3.8   Inpatient medications:  acetaminophen  325 mg Rectal Once   heparin  5,000 Units Subcutaneous Q8H    dextrose     acetaminophen **OR** acetaminophen, ondansetron **OR** ondansetron (ZOFRAN) IV

## 2022-07-16 NOTE — Progress Notes (Signed)
Pharmacy Antibiotic Note  [name] is a 66 y.o. male with sacral ulcer who presented to the ED from [facility] facility on 07/16/2022 with fever and AMS. Of note, pt was on ceftriaxone IM for 3 days PTA. 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  No drainable abscess observed. Left lower lobe airspace opacity and small left pleural effusion. Left lower lobe pneumonia is a distinct possibility." Pharmacy has been consulted to dose vancomycin and cefepime for infection.   Today, 07/16/2022: - scr 4.32 (was <1 a year ago) - wbc 17 - zosyn 3.375 gm IV x1 given in the ED at noon on 11/3  Plan: - vancomycin 1250 mg IV x1 given in the ED at 1:50p. Will check random vancomycin level in ~1-2 days and redose if/when level < 20 - cefepime 2gm q24h  _______________________________________  Weight: 62.1 kg (136 lb 14.4 oz)  Temp (24hrs), Avg:102.4 F (39.1 C), Min:100.6 F (38.1 C), Max:104.2 F (40.1 C)  Recent Labs  Lab 07/16/22 1126  WBC 17.0*  CREATININE 4.32*    CrCl cannot be calculated (Unknown ideal weight.).    No Known Allergies   Thank you for allowing pharmacy to be a part of this patient's care.  Lynelle Doctor 07/16/2022 3:54 PM

## 2022-07-16 NOTE — Progress Notes (Signed)
A consult was received from an ED physician for vancomycin per pharmacy dosing.  The patient's profile has been reviewed for ht/wt/allergies/indication/available labs.    A one time order has been placed for vancomycin 1250 mg.    Further antibiotics/pharmacy consults should be ordered by admitting physician if indicated.                       Thank you, Eudelia Bunch, Pharm.D Use secure chat for questions 07/16/2022 11:57 AM

## 2022-07-17 ENCOUNTER — Other Ambulatory Visit: Payer: Self-pay

## 2022-07-17 DIAGNOSIS — A419 Sepsis, unspecified organism: Secondary | ICD-10-CM | POA: Diagnosis not present

## 2022-07-17 DIAGNOSIS — N179 Acute kidney failure, unspecified: Secondary | ICD-10-CM | POA: Diagnosis not present

## 2022-07-17 DIAGNOSIS — G300 Alzheimer's disease with early onset: Secondary | ICD-10-CM | POA: Diagnosis not present

## 2022-07-17 DIAGNOSIS — F02C0 Dementia in other diseases classified elsewhere, severe, without behavioral disturbance, psychotic disturbance, mood disturbance, and anxiety: Secondary | ICD-10-CM | POA: Diagnosis not present

## 2022-07-17 LAB — GLUCOSE, CAPILLARY
Glucose-Capillary: 143 mg/dL — ABNORMAL HIGH (ref 70–99)
Glucose-Capillary: 111 mg/dL — ABNORMAL HIGH (ref 70–99)
Glucose-Capillary: 103 mg/dL — ABNORMAL HIGH (ref 70–99)
Glucose-Capillary: 108 mg/dL — ABNORMAL HIGH (ref 70–99)

## 2022-07-17 LAB — CBC
HCT: 35.5 % — ABNORMAL LOW (ref 39.0–52.0)
Hemoglobin: 10.3 g/dL — ABNORMAL LOW (ref 13.0–17.0)
MCH: 28.9 pg (ref 26.0–34.0)
MCHC: 29 g/dL — ABNORMAL LOW (ref 30.0–36.0)
MCV: 99.7 fL (ref 80.0–100.0)
Platelets: 127 10*3/uL — ABNORMAL LOW (ref 150–400)
RBC: 3.56 MIL/uL — ABNORMAL LOW (ref 4.22–5.81)
RDW: 15.2 % (ref 11.5–15.5)
WBC: 12.8 10*3/uL — ABNORMAL HIGH (ref 4.0–10.5)
nRBC: 0.3 % — ABNORMAL HIGH (ref 0.0–0.2)

## 2022-07-17 LAB — BASIC METABOLIC PANEL
Anion gap: 10 (ref 5–15)
BUN: 69 mg/dL — ABNORMAL HIGH (ref 8–23)
CO2: 20 mmol/L — ABNORMAL LOW (ref 22–32)
Calcium: 7.4 mg/dL — ABNORMAL LOW (ref 8.9–10.3)
Chloride: 126 mmol/L — ABNORMAL HIGH (ref 98–111)
Creatinine, Ser: 3.32 mg/dL — ABNORMAL HIGH (ref 0.61–1.24)
GFR, Estimated: 20 mL/min — ABNORMAL LOW (ref >60)
Glucose, Bld: 106 mg/dL — ABNORMAL HIGH (ref 70–99)
Potassium: 3.4 mmol/L — ABNORMAL LOW (ref 3.5–5.1)
Sodium: 156 mmol/L — ABNORMAL HIGH (ref 135–145)

## 2022-07-17 LAB — COMPREHENSIVE METABOLIC PANEL
ALT: 61 U/L — ABNORMAL HIGH (ref 0–44)
AST: 253 U/L — ABNORMAL HIGH (ref 15–41)
Albumin: 1.9 g/dL — ABNORMAL LOW (ref 3.5–5.0)
Alkaline Phosphatase: 40 U/L (ref 38–126)
Anion gap: 9 (ref 5–15)
BUN: 71 mg/dL — ABNORMAL HIGH (ref 8–23)
CO2: 19 mmol/L — ABNORMAL LOW (ref 22–32)
Calcium: 7.3 mg/dL — ABNORMAL LOW (ref 8.9–10.3)
Chloride: 128 mmol/L — ABNORMAL HIGH (ref 98–111)
Creatinine, Ser: 4.02 mg/dL — ABNORMAL HIGH (ref 0.61–1.24)
GFR, Estimated: 16 mL/min — ABNORMAL LOW (ref >60)
Glucose, Bld: 135 mg/dL — ABNORMAL HIGH (ref 70–99)
Potassium: 3.4 mmol/L — ABNORMAL LOW (ref 3.5–5.1)
Sodium: 156 mmol/L — ABNORMAL HIGH (ref 135–145)
Total Bilirubin: 0.6 mg/dL (ref 0.3–1.2)
Total Protein: 5 g/dL — ABNORMAL LOW (ref 6.5–8.1)

## 2022-07-17 LAB — URINE CULTURE: Culture: NO GROWTH

## 2022-07-17 LAB — MRSA NEXT GEN BY PCR, NASAL: MRSA by PCR Next Gen: NOT DETECTED

## 2022-07-17 LAB — ABO/RH: ABO/RH(D): O POS

## 2022-07-17 MED ORDER — POTASSIUM CHLORIDE CRYS ER 20 MEQ PO TBCR
20.0000 meq | EXTENDED_RELEASE_TABLET | Freq: Once | ORAL | Status: AC
  Administered 2022-07-17: 20 meq via ORAL
  Filled 2022-07-17: qty 1

## 2022-07-17 MED ORDER — LACTATED RINGERS IV BOLUS
500.0000 mL | Freq: Once | INTRAVENOUS | Status: AC
  Administered 2022-07-17: 500 mL via INTRAVENOUS

## 2022-07-17 MED ORDER — VANCOMYCIN VARIABLE DOSE PER UNSTABLE RENAL FUNCTION (PHARMACIST DOSING): Status: DC

## 2022-07-17 MED ORDER — CHLORHEXIDINE GLUCONATE CLOTH 2 % EX PADS
6.0000 | MEDICATED_PAD | Freq: Every day | CUTANEOUS | Status: DC
  Administered 2022-07-17 – 2022-07-21 (×5): 6 via TOPICAL

## 2022-07-17 MED ORDER — SENNOSIDES-DOCUSATE SODIUM 8.6-50 MG PO TABS
2.0000 | ORAL_TABLET | Freq: Two times a day (BID) | ORAL | Status: DC
  Administered 2022-07-17 – 2022-07-21 (×7): 2 via ORAL
  Filled 2022-07-17 (×8): qty 2

## 2022-07-17 MED ORDER — SODIUM CHLORIDE 0.9 % IV SOLN: INTRAVENOUS | Status: DC

## 2022-07-17 MED ORDER — POLYETHYLENE GLYCOL 3350 17 G PO PACK
17.0000 g | PACK | Freq: Once | ORAL | Status: AC
  Administered 2022-07-17: 17 g via ORAL
  Filled 2022-07-17: qty 1

## 2022-07-17 NOTE — Progress Notes (Signed)
Pharmacy Antibiotic Note  [name] is a 66 y.o. male with sacral ulcer who presented to the ED from [facility] facility on 07/16/2022 with fever and AMS. Of note, pt was on ceftriaxone IM for 3 days PTA. 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  No drainable abscess observed. Left lower lobe airspace opacity and small left pleural effusion. Left lower lobe pneumonia is a distinct possibility." Pharmacy has been consulted to dose vancomycin and cefepime for infection.   Today, 07/17/2022: - SCr decreased 4.32>4> 3.3 today (was <1 a year ago) - wbc down to 12.8 - TBW < IBW  Plan: - vancomycin 1250 mg IV x1 given in the ED on 07/16/22 at 1:50p. Will check random vancomycin level in ~48 hours and redose if/when level < 20 - cefepime 2gm q24h  _______________________________________  Height: 5\' 6"  (167.6 cm) (per record from [place]. Noted to be height on 10/02/19) Weight: 62.1 kg (136 lb 14.4 oz) IBW/kg (Calculated) : 63.8  Temp (24hrs), Avg:97.7 F (36.5 C), Min:97.4 F (36.3 C), Max:98.2 F (36.8 C)  Recent Labs  Lab 07/16/22 1126 07/17/22 0621 07/17/22 1602  WBC 17.0* 12.8*  --   CREATININE 4.32* 4.02* 3.32*     Estimated Creatinine Clearance: 20 mL/min (A) (by C-G formula based on SCr of 3.32 mg/dL (H)).    No Known Allergies  Antimicrobials this admission:  PTA 10/31 CTX 1 gm IM q24 x 3 days> 11/2 11/3 zosyn x1 11/3 vanc>> 11/3 cefepime>>  Dose adjustments this admission:  11/5 1400 Vanc rand:   Microbiology results:  11/3 BCx2: ngtd 11/3 UCx: NGF 11/4 MRSA PCR: not detected   Thank you for allowing pharmacy to be a part of this patient's care.  Gretta Arab PharmD, BCPS [facility] main pharmacy [phone] 07/17/2022 5:35 PM

## 2022-07-17 NOTE — Progress Notes (Signed)
Patient with low BP in the 80s, A. Elizebeth Brooking- NP notified, no new order given will continue to assess patient.

## 2022-07-17 NOTE — Evaluation (Signed)
Clinical/Bedside Swallow Evaluation Patient Details  Name: [name] MRN: [id number] Date of Birth: [birth year]  Today's Date: 07/17/2022 Time: SLP Start Time (ACUTE ONLY): 1140 SLP Stop Time (ACUTE ONLY): 1200 SLP Time Calculation (min) (ACUTE ONLY): 20 min  Past Medical History:  Past Medical History:  Diagnosis Date   Alzheimer's dementia ([place])    Anxiety    Hypercholesterolemia    Hypertension    Past Surgical History: History reviewed. No pertinent surgical history. HPI:  Patient is a 65 y.o. male with PMH: advanced early onset dementia (diagnosed at age 29), HTN, HLD, bedbound at SNF. He presented to the hospital on 07/16/22 from his SNF due to fever for past two days with associated decreased level of consciousness. In ED he was febrile at 104.2 F, respirations 40, UA was hazy, portable CXR showed left LL retrocardic airspace which may represent PNA, CT chest/abdomen/pelvis without contrast showed a sacrococcygeal decubitus ulcer with coccygeal osteomyelitis and local infection, left LL infiltrate and small left pleural effusion, mucous in the trachea and both mainstem bronchi nearly completely filling the bronchus intermedius and plugging the right lower lobe bronchi.  Prominent stool throughout the colon.  Aortic and coronary atherosclerosis.    Assessment / Plan / Recommendation  Clinical Impression  Patient presents with a mild oropharyngeal dysphagia as per this bedside/clinical swallow evaluation. His wife was present in the room and she denied patient having significant difficulty with PO intake at SNF and to her knowledge, he has not been seen by SLP at [facility]. Patient was awake, alert and cooperative, essentially non-verbal as far as communicative intent but patient did speak and at times would make a comment that was appropriate to the situation. He exhibited delayed coughing after sips of thin liquids (water) via straw sip but when drinking nectar thick liquids through straw,  he did not exhibit any immediate or delayed coughing. He tolerated gelatin and puree solids with mild oral delays but no overt s/s pharyngeal phase impairment. SLP recommending Dys 1 (puree) solids and nectar thick liquids and will follow patient for diet toleration. SLP Visit Diagnosis: Dysphagia, unspecified (R13.10)    Aspiration Risk  Mild aspiration risk    Diet Recommendation Nectar-thick liquid;Dysphagia 1 (Puree)   Liquid Administration via: Cup;Straw Medication Administration: Crushed with puree Supervision: Staff to assist with self feeding;Full supervision/cueing for compensatory strategies Compensations: Slow rate;Small sips/bites;Minimize environmental distractions Postural Changes: Seated upright at 90 degrees    Other  Recommendations Oral Care Recommendations: Oral care BID;Staff/trained caregiver to provide oral care Other Recommendations: Order thickener from pharmacy;Clarify dietary restrictions;Have oral suction available    Recommendations for follow up therapy are one component of a multi-disciplinary discharge planning process, led by the attending physician.  Recommendations may be updated based on patient status, additional functional criteria and insurance authorization.  Follow up Recommendations Skilled nursing-short term rehab (<3 hours/day)      Assistance Recommended at Discharge Frequent or constant Supervision/Assistance  Functional Status Assessment Patient has had a recent decline in their functional status and demonstrates the ability to make significant improvements in function in a reasonable and predictable amount of time.  Frequency and Duration min 2x/week  1 week       Prognosis Prognosis for Safe Diet Advancement: Fair Barriers to Reach Goals: Severity of deficits;Time post onset Barriers/Prognosis Comment: patient was diagnosed with early onset dementia 9 years ago, so any diet advancements would be temporary      Swallow Study    General Date of  Onset: 07/16/22 HPI: Patient is a 66 y.o. male with PMH: advanced early onset dementia (diagnosed at age 54), HTN, HLD, bedbound at SNF. He presented to the hospital on 07/16/22 from his SNF due to fever for past two days with associated decreased level of consciousness. In ED he was febrile at 104.2 F, respirations 40, UA was hazy, portable CXR showed left LL retrocardic airspace which may represent PNA, CT chest/abdomen/pelvis without contrast showed a sacrococcygeal decubitus ulcer with coccygeal osteomyelitis and local infection, left LL infiltrate and small left pleural effusion, mucous in the trachea and both mainstem bronchi nearly completely filling the bronchus intermedius and plugging the right lower lobe bronchi.  Prominent stool throughout the colon.  Aortic and coronary atherosclerosis. Type of Study: Bedside Swallow Evaluation Previous Swallow Assessment: none found Diet Prior to this Study: NPO Temperature Spikes Noted: No Respiratory Status: Room air History of Recent Intubation: No Behavior/Cognition: Alert;Cooperative;Pleasant mood Oral Cavity Assessment: Within Functional Limits Oral Care Completed by SLP: Yes Oral Cavity - Dentition: Adequate natural dentition Self-Feeding Abilities: Total assist Patient Positioning: Upright in bed Baseline Vocal Quality: Normal Volitional Cough: Cognitively unable to elicit Volitional Swallow: Unable to elicit    Oral/Motor/Sensory Function Overall Oral Motor/Sensory Function: Within functional limits   Ice Chips     Thin Liquid Thin Liquid: Impaired Presentation: Straw Pharyngeal  Phase Impairments: Cough - Delayed    Nectar Thick Nectar Thick Liquid: Within functional limits Presentation: Straw   Honey Thick     Puree Puree: Impaired Presentation: Spoon Other Comments: patient appears to be masticating applesauce; mildly delayed oral phase   Solid     Solid: Not tested     Sonia Baller, MA,  CCC-SLP Speech Therapy

## 2022-07-17 NOTE — Progress Notes (Signed)
[facility]                                                                               [name], is a 66 y.o. male, DOB - [birth year], FXT:[id number] Admit date - 07/16/2022    Outpatient Primary MD for the patient is Jonathon Jordan, MD  LOS - 1  days    Brief summary     [place] is a 66 y.o. male with medical history significant of advanced early Alzheimer's dementia, anxiety, hypertension, hyperlipidemia who is brought from his facility due to fever for the past 2 days associated with decreased level of consciousness.  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  No drainable abscess observed.  Left lower lobe infiltrate and small left pleural effusion.  There is mucus in the trachea and both mainstem bronchi nearly completely filling the bronchus intermedius and plugging the right lower lobe bronchi.    Assessment and Plan:  Sepsis secondary to infected sacro coccygeal decubitus ulcer with coccygeal OM and left lower lobe pneumonia.  Sepsis physiology improving.  Currently requiring about 2 to 3 lit of [place] oxygen.  Started him on IV vancomycin and IV cefepime.  Follow up blood cultures. Follow pro calcitonin.  COVID OCR and influenza PCR is negative.  Urine for strep pneumonia and legionella antigen ordered and pending.  Sputum cultures ordered, if able to obtain.  Will get ID consult in am for the duration of the antibiotics.  Improving leukocytosis.   Acute metabolic encephalopathy in the setting of severe Alzheimer dementia.  - from AKI, Uremia and hypernatremia and sepsis from OM of the coccygeal area and LLLpneumonia.  - check TSH , vit G99 and folic acid level.  Slp evaluation ordered and recommended dysphagia 1 diet.    AKI with AG metabolic acidosis:  Suspect from poor oral intake and sepsis.  US RENAL Pending.  Nephrology on  board and unfortunately patient is not a candidate for HD.  Creatinine improved to 3.32 with IV fluids.  Foley catheter placed for measuring urine output.    Hypernatremia: from free fluid deficit.  Admitted with a sodium of 167 , improved to 156.  Follow up BMP tonight and in am.  Continue with Dextrose fluids.    Hypokalemia:  Replaced. Recheck in am. Get magnesium level.    Alzheimer's Dementia:  Started 9 years ago and has been bed bound for the last 2 years.   Normocytic Anemia: baseline hemoglobin 3 years ago was around 12.  Hemoglobin today is 10.3 Get anemia panel.  Suspect drop of hemoglobin from hemodilution.     Mild thrombocytopenia:  From sepsis?    Elevated transaminases:  - suspect form hypotension, sepsis. Follow up liver enzymes tomorrow.     In view of advanced dementia, poor functional status, palliative care consulted for goals of care. Wife is requesting for possible hospice to follow up at the SNF.      Pressure injury:  RN Pressure Injury Documentation: Pressure Injury 07/16/22 Sacrum Medial Unstageable - Full thickness tissue loss in which the base of the injury  is covered by slough (yellow, tan, gray, green or brown) and/or eschar (tan, brown or black) in the wound bed. Sloughs, eschar, drainage and o (Active)  07/16/22 2209  Location: Sacrum  Location Orientation: Medial  Staging: Unstageable - Full thickness tissue loss in which the base of the injury is covered by slough (yellow, tan, gray, green or brown) and/or eschar (tan, brown or black) in the wound bed.  Wound Description (Comments): Sloughs, eschar, drainage and odor  Present on Admission: Yes  Dressing Type Foam - Lift dressing to assess site every shift 07/17/22 1100   Wound care consulted.   Estimated body mass index is 22.1 kg/m as calculated from the following:   Height as of this encounter: 5\' 6"  (1.676 m).   Weight as of this encounter: 62.1 kg.  Code Status: DNR DVT  Prophylaxis:  heparin injection 5,000 Units Start: 07/16/22 2200   Level of Care: Level of care: Progressive Family Communication: Updated patient's wife at bedside.   Disposition Plan:     Remains inpatient appropriate:  IV antibiotics.   Procedures:  NONE  Consultants:   Palliative care Nephrology.   Antimicrobials:   Anti-infectives (From admission, onward)    Start     Dose/Rate Route Frequency Ordered Stop   07/16/22 2000  ceFEPIme (MAXIPIME) 2 g in sodium chloride 0.9 % 100 mL IVPB        2 g 200 mL/hr over 30 Minutes Intravenous Every 24 hours 07/16/22 1738     07/16/22 1738  vancomycin variable dose per unstable renal function (pharmacist dosing)         Does not apply See admin instructions 07/16/22 1738     07/16/22 1215  vancomycin (VANCOREADY) IVPB 1250 mg/250 mL        1,250 mg 166.7 mL/hr over 90 Minutes Intravenous  Once 07/16/22 1134 07/16/22 1520   07/16/22 1130  piperacillin-tazobactam (ZOSYN) IVPB 3.375 g        3.375 g 100 mL/hr over 30 Minutes Intravenous  Once 07/16/22 1127 07/16/22 1234        Medications  Scheduled Meds:  Chlorhexidine Gluconate Cloth  6 each Topical Daily   heparin  5,000 Units Subcutaneous Q8H   vancomycin variable dose per unstable renal function (pharmacist dosing)   Does not apply See admin instructions   Continuous Infusions:  sodium chloride 100 mL/hr at 07/17/22 1051   ceFEPime (MAXIPIME) IV 2 g (07/16/22 2010)   dextrose 165 mL/hr at 07/17/22 0156   PRN Meds:.acetaminophen **OR** acetaminophen, haloperidol lactate, ondansetron **OR** ondansetron (ZOFRAN) IV    Subjective:   [name] was seen and examined today.  More alert today, and appears to recognize his wife.   Objective:   Vitals:   07/17/22 0052 07/17/22 0527 07/17/22 0900 07/17/22 1345  BP: (!) 91/53 (!) 85/62 129/61 (!) 141/87  Pulse: 69 (!) 58  (!) 42  Resp:  (!) 22  16  Temp: (!) 97.4 F (36.3 C) 97.6 F (36.4 C)  97.8 F (36.6 C)   TempSrc: Oral Oral  Oral  SpO2: 100% 99%  (!) 70%  Weight:      Height:        Intake/Output Summary (Last 24 hours) at 07/17/2022 1413 Last data filed at 07/17/2022 0625 Gross per 24 hour  Intake 652.8 ml  Output 850 ml  Net -197.2 ml   Filed Weights   07/16/22 1544  Weight: 62.1 kg     Exam General exam: Appears calm and  comfortable  Respiratory system: Clear to auscultation. Respiratory effort normal. Cardiovascular system: S1 & S2 heard, RRR. No JVD, No pedal edema. Gastrointestinal system: Abdomen is nondistended, soft  Normal bowel sounds heard. Central nervous system: Alert , advanced dementia, not following commands.  Bed bound.  Extremities: no cyanosis.  Skin: stage 4 Decubitus ulcer.  Psychiatry: unable to assess due to advanced dementia.     Data Reviewed:  I have personally reviewed following labs and imaging studies   CBC Lab Results  Component Value Date   WBC 12.8 (H) 07/17/2022   RBC 3.56 (L) 07/17/2022   HGB 10.3 (L) 07/17/2022   HCT 35.5 (L) 07/17/2022   MCV 99.7 07/17/2022   MCH 28.9 07/17/2022   PLT 127 (L) 07/17/2022   MCHC 29.0 (L) 07/17/2022   RDW 15.2 07/17/2022   LYMPHSABS 1.3 07/16/2022   MONOABS 1.1 (H) 07/16/2022   EOSABS 0.0 07/16/2022   BASOSABS 0.1 [id number]     Last metabolic panel Lab Results  Component Value Date   NA 156 (H) 07/17/2022   K 3.4 (L) 07/17/2022   CL 128 (H) 07/17/2022   CO2 19 (L) 07/17/2022   BUN 71 (H) 07/17/2022   CREATININE 4.02 (H) 07/17/2022   GLUCOSE 135 (H) 07/17/2022   GFRNONAA 16 (L) 07/17/2022   GFRAA >60 02/04/2020   CALCIUM 7.3 (L) 07/17/2022   PROT 5.0 (L) 07/17/2022   ALBUMIN 1.9 (L) 07/17/2022   BILITOT 0.6 07/17/2022   ALKPHOS 40 07/17/2022   AST 253 (H) 07/17/2022   ALT 61 (H) 07/17/2022   ANIONGAP 9 07/17/2022    CBG (last 3)  Recent Labs    07/17/22 0128 07/17/22 0618 07/17/22 1157  GLUCAP 143* 111* 103*      Coagulation Profile: Recent Labs  Lab 07/16/22 1126   INR 1.4*     Radiology Studies: CT CHEST ABDOMEN PELVIS WO CONTRAST  Result Date: 07/16/2022 CLINICAL DATA:  Sepsis.  Dementia. EXAM: CT CHEST, ABDOMEN AND PELVIS WITHOUT CONTRAST TECHNIQUE: Multidetector CT imaging of the chest, abdomen and pelvis was performed following the standard protocol without IV contrast. RADIATION DOSE REDUCTION: This exam was performed according to the departmental dose-optimization program which includes automated exposure control, adjustment of the mA and/or kV according to patient size and/or use of iterative reconstruction technique. COMPARISON:  Chest radiograph 07/16/2022 FINDINGS: Despite efforts by the technologist and patient, motion artifact is present on today's exam and could not be eliminated. This reduces exam sensitivity and specificity. CT CHEST FINDINGS Cardiovascular: Left anterior descending and right coronary artery atherosclerotic calcification along with thoracic aortic atherosclerotic calcification. Mediastinum/Nodes: Unremarkable Lungs/Pleura: Left lower lobe airspace opacity and small left pleural effusion. Left lower lobe pneumonia is a distinct possibility. Atelectasis and minimal posteromedial airspace opacity peripherally in the right lower lobe, cannot exclude a component of right lower lobe pneumonia. There is mucus in the trachea and both mainstem bronchi, nearly completely filling the bronchus intermedius and plugging the right lower lobe bronchi. Musculoskeletal: Healed deformities of left posteromedial ribs compatible with old healed rib fractures. CT ABDOMEN PELVIS FINDINGS Hepatobiliary: Scattered fluid density lesions in the left hepatic lobe to a lesser degree in the right hepatic lobe, likely cysts. Index lesion in segment 4 of the liver measures 3.2 by 1.9 cm. Gallbladder unremarkable. Pancreas: Unremarkable Spleen: Unremarkable Adrenals/Urinary Tract: Foley catheter in the otherwise empty urinary bladder. The kidneys and adrenal glands  appear normal. Stomach/Bowel: Prominent stool throughout the colon favors constipation. Moderate prominence of stool in the  rectal vault, no perirectal stranding to suggest stercoral colitis. Vascular/Lymphatic: Atherosclerosis is present, including aortoiliac atherosclerotic disease. No pathologic adenopathy observed. Reproductive: Unremarkable Other: No supplemental non-categorized findings. Musculoskeletal: Sacrococcygeal decubitus ulcer with abdomen sphere gas or soft tissue gas extending to the periosteal margin of the posterior coccyx as on images 114 through 119 of series 5. There is also a small amount of abnormal pre coccygeal gas as well as presacral and pre coccygeal edema favoring coccygeal osteomyelitis and local infection. No drainable abscess observed. 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 IMPRESSION: 1. 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 No drainable abscess observed. 2. Left lower lobe airspace opacity and small left pleural effusion. Left lower lobe pneumonia is a distinct possibility. There is mucus in the trachea and both mainstem bronchi, nearly completely filling the bronchus intermedius and plugging the right lower lobe bronchi. There is also some atelectasis and minimal posteromedial airspace opacity in the right lower lobe, cannot exclude a component of right lower lobe pneumonia. 3. Prominent stool throughout the colon favors constipation. Moderate prominence of stool in the rectal vault, no perirectal stranding to suggest stercoral colitis. 4. 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 5. Aortic and coronary atherosclerosis. Aortic Atherosclerosis (ICD10-I70.0). Electronically Signed   By: Van Clines M.D.   On: 07/16/2022 14:13   DG Chest Port 1 View  Result Date: 07/16/2022 CLINICAL DATA:  Sepsis EXAM: PORTABLE CHEST 1 VIEW  COMPARISON:  Prior chest x-ray 06/15/2021 FINDINGS: Low inspiratory volumes. Airspace opacity in the left retrocardiac region obscures the medial hemidiaphragm. Chronic atelectasis versus scarring in the lingula appears similar. The remaining lungs are clear. Cardiac and mediastinal contours are within normal limits. No pneumothorax. No acute osseous abnormality. Chronic elevation of the left hemidiaphragm anteriorly. IMPRESSION: 1. Left lower lobe retrocardiac airspace opacity may represent pneumonia in the setting of sepsis. 2. Similar appearance of chronic scarring versus atelectasis in the lingula with associated elevation of the left hemidiaphragm. Electronically Signed   By: Jacqulynn Cadet M.D.   On: 07/16/2022 12:07       Hosie Poisson M.D. [facility] 07/17/2022, 2:13 PM  Available via Epic secure chat 7am-7pm After 7 pm, please refer to night coverage provider listed on amion.

## 2022-07-17 NOTE — Progress Notes (Signed)
[facility] Progress Note  Subjective: UOP 850 cc overnight. Creat down 4.0. Pt looks better, minimal twitching today  Vitals:   07/16/22 2100 07/17/22 0052 07/17/22 0527 07/17/22 0900  BP: 109/75 (!) 91/53 (!) 85/62 129/61  Pulse: 84 69 (!) 58   Resp: 20  (!) 22   Temp: 97.7 F (36.5 C) (!) 97.4 F (36.3 C) 97.6 F (36.4 C)   TempSrc: Axillary Oral Oral   SpO2: 99% 100% 99%   Weight:      Height:        Exam: Gen twitching resolved, eyes open, staring off mostly, does not respond to questions, looks better overall Chest clear bilat to bases RRR no MRG Abd soft ntnd no mass or ascites +bs GU normal male MS possible LE > UE contractures Ext no LE or UE edema    Home meds include - depakote, memantine, metoprolol xl 25, mVI, vits/ supps/ prns    I/O 5800 bolus + IVF"s   BP's 90- 120/ 50-70, HR 78 better, RR 20-24   UA amber, prot 30, 0-5 rbc/ wbc   Assessment/ Plan: AKI - b/l creat 0.8 from Oct 2022, eGFR > 60. Creat here is 4.2 in setting of poor po intake recently, sepsis/ hypotension, infected sacral decub w/ osteo and possibly PNA. Pt looks quite dry on exam. Na+ was high. UA negative, renal US pending. AKI is due to hypotension due to hypovolemia and/or sepsis. Gave 3 L additional bolus yesterday. Foley in, 800 cc UOP overnight which is goo. Creat down slightly and looks more euvolemic today. Would dc IV vanc if possible. Pt is not a HD candidate. Cont [place] at 100 cc/hr.  Hypernatremia - halfway corrected, cont D5W at 165 cc/hr.  Sacral decub/ osteo - getting IV abx LLL pna Dementia - bedbound at baseline  HTN - hold home meds for now DNR            Kelly Splinter 07/17/2022, 11:21 AM   Recent Labs  Lab 07/16/22 1126 07/17/22 0621  HGB 14.1 10.3*  ALBUMIN 2.7* 1.9*  CALCIUM 9.1 7.3*  CREATININE 4.32* 4.02*  K 3.8 3.4*   No results for input(s): "IRON", "TIBC", "FERRITIN" in the last 168 hours. Inpatient medications:  Chlorhexidine  Gluconate Cloth  6 each Topical Daily   heparin  5,000 Units Subcutaneous Q8H   vancomycin variable dose per unstable renal function (pharmacist dosing)   Does not apply See admin instructions    sodium chloride 100 mL/hr at 07/17/22 1051   ceFEPime (MAXIPIME) IV 2 g (07/16/22 2010)   dextrose 165 mL/hr at 07/17/22 0156   acetaminophen **OR** acetaminophen, haloperidol lactate, ondansetron **OR** ondansetron (ZOFRAN) IV

## 2022-07-18 DIAGNOSIS — A419 Sepsis, unspecified organism: Secondary | ICD-10-CM | POA: Diagnosis not present

## 2022-07-18 DIAGNOSIS — E870 Hyperosmolality and hypernatremia: Secondary | ICD-10-CM

## 2022-07-18 DIAGNOSIS — N179 Acute kidney failure, unspecified: Secondary | ICD-10-CM | POA: Diagnosis not present

## 2022-07-18 DIAGNOSIS — G300 Alzheimer's disease with early onset: Secondary | ICD-10-CM | POA: Diagnosis not present

## 2022-07-18 DIAGNOSIS — M869 Osteomyelitis, unspecified: Secondary | ICD-10-CM

## 2022-07-18 DIAGNOSIS — J690 Pneumonitis due to inhalation of food and vomit: Secondary | ICD-10-CM | POA: Diagnosis not present

## 2022-07-18 LAB — CBC WITH DIFFERENTIAL/PLATELET
Abs Immature Granulocytes: 0.32 10*3/uL — ABNORMAL HIGH (ref 0.00–0.07)
Basophils Absolute: 0 10*3/uL (ref 0.0–0.1)
Basophils Relative: 0 %
Eosinophils Absolute: 0.2 10*3/uL (ref 0.0–0.5)
Eosinophils Relative: 1 %
HCT: 30.6 % — ABNORMAL LOW (ref 39.0–52.0)
Hemoglobin: 9.3 g/dL — ABNORMAL LOW (ref 13.0–17.0)
Immature Granulocytes: 3 %
Lymphocytes Relative: 7 %
Lymphs Abs: 0.9 10*3/uL (ref 0.7–4.0)
MCH: 28.4 pg (ref 26.0–34.0)
MCHC: 30.4 g/dL (ref 30.0–36.0)
MCV: 93.3 fL (ref 80.0–100.0)
Monocytes Absolute: 0.4 10*3/uL (ref 0.1–1.0)
Monocytes Relative: 3 %
Neutro Abs: 11.1 10*3/uL — ABNORMAL HIGH (ref 1.7–7.7)
Neutrophils Relative %: 86 %
Platelets: 152 10*3/uL (ref 150–400)
RBC: 3.28 MIL/uL — ABNORMAL LOW (ref 4.22–5.81)
RDW: 14.4 % (ref 11.5–15.5)
WBC: 12.9 10*3/uL — ABNORMAL HIGH (ref 4.0–10.5)
nRBC: 0.3 % — ABNORMAL HIGH (ref 0.0–0.2)

## 2022-07-18 LAB — COMPREHENSIVE METABOLIC PANEL
ALT: 71 U/L — ABNORMAL HIGH (ref 0–44)
AST: 177 U/L — ABNORMAL HIGH (ref 15–41)
Albumin: 1.8 g/dL — ABNORMAL LOW (ref 3.5–5.0)
Alkaline Phosphatase: 42 U/L (ref 38–126)
Anion gap: 3 — ABNORMAL LOW (ref 5–15)
BUN: 47 mg/dL — ABNORMAL HIGH (ref 8–23)
CO2: 20 mmol/L — ABNORMAL LOW (ref 22–32)
Calcium: 7.2 mg/dL — ABNORMAL LOW (ref 8.9–10.3)
Chloride: 123 mmol/L — ABNORMAL HIGH (ref 98–111)
Creatinine, Ser: 1.94 mg/dL — ABNORMAL HIGH (ref 0.61–1.24)
GFR, Estimated: 38 mL/min — ABNORMAL LOW (ref >60)
Glucose, Bld: 140 mg/dL — ABNORMAL HIGH (ref 70–99)
Potassium: 3.1 mmol/L — ABNORMAL LOW (ref 3.5–5.1)
Sodium: 146 mmol/L — ABNORMAL HIGH (ref 135–145)
Total Bilirubin: 0.4 mg/dL (ref 0.3–1.2)
Total Protein: 4.6 g/dL — ABNORMAL LOW (ref 6.5–8.1)

## 2022-07-18 LAB — BASIC METABOLIC PANEL
Anion gap: 5 (ref 5–15)
BUN: 29 mg/dL — ABNORMAL HIGH (ref 8–23)
CO2: 18 mmol/L — ABNORMAL LOW (ref 22–32)
Calcium: 7.4 mg/dL — ABNORMAL LOW (ref 8.9–10.3)
Chloride: 122 mmol/L — ABNORMAL HIGH (ref 98–111)
Creatinine, Ser: 1.21 mg/dL (ref 0.61–1.24)
GFR, Estimated: >60 mL/min (ref >60)
Glucose, Bld: 114 mg/dL — ABNORMAL HIGH (ref 70–99)
Potassium: 3.5 mmol/L (ref 3.5–5.1)
Sodium: 145 mmol/L (ref 135–145)

## 2022-07-18 LAB — GLUCOSE, CAPILLARY
Glucose-Capillary: 113 mg/dL — ABNORMAL HIGH (ref 70–99)
Glucose-Capillary: 118 mg/dL — ABNORMAL HIGH (ref 70–99)
Glucose-Capillary: 131 mg/dL — ABNORMAL HIGH (ref 70–99)
Glucose-Capillary: 71 mg/dL (ref 70–99)
Glucose-Capillary: 90 mg/dL (ref 70–99)

## 2022-07-18 LAB — IRON AND TIBC
Iron: 27 ug/dL — ABNORMAL LOW (ref 45–182)
Saturation Ratios: 24 % (ref 17.9–39.5)
TIBC: 114 ug/dL — ABNORMAL LOW (ref 250–450)
UIBC: 87 ug/dL

## 2022-07-18 LAB — RETICULOCYTES
Immature Retic Fract: 6.7 % (ref 2.3–15.9)
RBC.: 3.23 MIL/uL — ABNORMAL LOW (ref 4.22–5.81)
Retic Count, Absolute: 15.5 10*3/uL — ABNORMAL LOW (ref 19.0–186.0)
Retic Ct Pct: 0.5 % (ref 0.4–3.1)

## 2022-07-18 LAB — FERRITIN: Ferritin: 961 ng/mL — ABNORMAL HIGH (ref 24–336)

## 2022-07-18 LAB — STREP PNEUMONIAE URINARY ANTIGEN: Strep Pneumo Urinary Antigen: NEGATIVE

## 2022-07-18 LAB — FOLATE: Folate: 9.7 ng/mL (ref >5.9)

## 2022-07-18 LAB — TSH: TSH: 2.049 u[IU]/mL (ref 0.350–4.500)

## 2022-07-18 LAB — VANCOMYCIN, RANDOM: Vancomycin Rm: 8 ug/mL

## 2022-07-18 LAB — VITAMIN B12: Vitamin B-12: 2370 pg/mL — ABNORMAL HIGH (ref 180–914)

## 2022-07-18 LAB — PHOSPHORUS: Phosphorus: 2.3 mg/dL — ABNORMAL LOW (ref 2.5–4.6)

## 2022-07-18 LAB — MAGNESIUM: Magnesium: 2.4 mg/dL (ref 1.7–2.4)

## 2022-07-18 MED ORDER — POTASSIUM PHOSPHATES 15 MMOLE/5ML IV SOLN
30.0000 mmol | Freq: Once | INTRAVENOUS | Status: AC
  Administered 2022-07-18: 30 mmol via INTRAVENOUS
  Filled 2022-07-18: qty 10

## 2022-07-18 MED ORDER — SODIUM CHLORIDE 0.45 % IV SOLN: INTRAVENOUS | Status: DC

## 2022-07-18 MED ORDER — DAKINS (1/4 STRENGTH) 0.125 % EX SOLN
Freq: Two times a day (BID) | CUTANEOUS | Status: AC
  Administered 2022-07-19 – 2022-07-20 (×2): 1
  Filled 2022-07-18: qty 473

## 2022-07-18 MED ORDER — DEXTROSE 5 % IV SOLN: INTRAVENOUS | Status: DC

## 2022-07-18 MED ORDER — SODIUM CHLORIDE 0.9 % IV SOLN
2.0000 g | Freq: Two times a day (BID) | INTRAVENOUS | Status: DC
  Administered 2022-07-18 – 2022-07-19 (×3): 2 g via INTRAVENOUS
  Filled 2022-07-18 (×3): qty 12.5

## 2022-07-18 MED ORDER — VANCOMYCIN HCL 750 MG/150ML IV SOLN
750.0000 mg | INTRAVENOUS | Status: DC
  Administered 2022-07-18: 750 mg via INTRAVENOUS
  Filled 2022-07-18: qty 150

## 2022-07-18 NOTE — Consult Note (Signed)
[facility] Nurse Consult Note: Reason for Consult:Stage 4 pressure injury to sacrum. See photodocumentation provided by Provider to EMR. Wound type:Pressure Pressure Injury POA: Yes Measurement: 5.5cm x 3cm x 3cm per Nursing Flow Sheet Wound bed: 75% nonviable tissue in wound bed, 25% red moist tissue and  Drainage (amount, consistency, odor) moderate, foul odor Periwound:macerated Dressing procedure/placement/frequency: I have provided the patient with a mattress replacement with low air loss feature, turning and repositioning is in place, I have added guidance to minimize time in the supine position. Bilateral pressure redistribution heel boots are added.  Topical care will be for the use of twice daily cleansing with sodium hypochlorite solution (Dakins) and then filling of the defect with Dakin's solution moistened gauze, topped with dry gauze and covering with a silicone foam. After 3 days, the twice daily wound cleansing and dressing will be with normal saline used instead of Dakin's solution. The periwound maceration will be addressed with application of a liquid barrier film (Cavillon) twice daily and allowing it to dry prior to performing wound care.  Recommend consultation with Surgery for evaluation of wound, need for further debridement, and input and oversight to the POC. If you agree, please order/arrange consult.  [facility] nursing team will not follow, but will remain available to this patient, the nursing and medical teams.  Please re-consult if needed.  Thank you for inviting [place] to participate in this patient's Plan of Care.  Maudie Flakes, MSN, RN, CNS, [place], Serita Grammes, [employer], [employer] phone:  [phone]

## 2022-07-18 NOTE — Progress Notes (Signed)
[facility] Progress Note  Subjective: UOP 1.9 L yest and 1.8 L overnight so far. BP's low 95- 110. Creat down to 3.3 yest evening and 1.9 this am.   Vitals:   07/17/22 1345 07/17/22 2153 07/17/22 2359 07/18/22 0502  BP: (!) 141/87 (!) 96/53 103/62 106/71  Pulse: (!) 42 91 92   Resp: 16 (!) 21 (!) 21 20  Temp: 97.8 F (36.6 C) 98.7 F (37.1 C) 99 F (37.2 C) 99.4 F (37.4 C)  TempSrc: Axillary Axillary Axillary Axillary  SpO2: (!) 70% 99% 99% 97%  Weight:      Height:        Exam: Gen lying, sleeping, did not awaken, no further twitching Chest clear bilat to bases RRR no MRG Abd soft ntnd no mass or ascites +bs GU normal male MS possible LE > UE contractures Ext no LE or UE edema    Home meds include - depakote, memantine, metoprolol xl 25, mVI, vits/ supps/ prns    I/O 5800 bolus + IVF"s   BP's 90- 120/ 50-70, HR 78 better, RR 20-24   UA amber, prot 30, 0-5 rbc/ wbc    CT abd noncon - Urinary Tract - foley catheter in the otherwise empty urinary bladder. The kidneys and adrenal glands appear normal.  Assessment/ Plan: AKI - b/l creat 0.8 from Oct 2022, eGFR > 60. Creat here is 4.2 in setting of poor po intake recently, sepsis/ hypotension, infected sacral decub w/ osteo and possibly PNA. Pt looked quite dry on exam. Na+ was high. UA was negative, CT abd showed normal appearing kidneys. AKI was due to hypotension from hypovolemia (severe) +/- sepsis. With lots of IVF"s and foley catheter, creat continues to improve down to 1.9 today. IVF"s decreased to maint fluids, not really eating yet. Should have full recovery of renal function. No other suggestions, will sign off.   Hypernatremia - almost corrected. Dec'd D5W to 125 cc/hr, dc when serum Na < 140.  Sacral decub/ osteo - IV abx per pmd LLL pna Dementia - bedbound at baseline  HTN - hold home meds for now DNR            Kelly Splinter 07/18/2022, 12:12 PM   Recent Labs  Lab 07/17/22 0621  07/17/22 1602 07/18/22 0428  HGB 10.3*  --  9.3*  ALBUMIN 1.9*  --  1.8*  CALCIUM 7.3* 7.4* 7.2*  PHOS  --   --  2.3*  CREATININE 4.02* 3.32* 1.94*  K 3.4* 3.4* 3.1*    Recent Labs  Lab 07/18/22 0428  IRON 27*  TIBC 114*  FERRITIN 961*   Inpatient medications:  Chlorhexidine Gluconate Cloth  6 each Topical Daily   heparin  5,000 Units Subcutaneous Q8H   senna-docusate  2 tablet Oral BID   sodium hypochlorite   Irrigation BID   vancomycin variable dose per unstable renal function (pharmacist dosing)   Does not apply See admin instructions    sodium chloride 100 mL/hr at 07/18/22 0424   ceFEPime (MAXIPIME) IV 2 g (07/18/22 1116)   dextrose 165 mL/hr at 07/17/22 1424   potassium PHOSPHATE IVPB (in mmol) 30 mmol (07/18/22 1154)   vancomycin     acetaminophen **OR** acetaminophen, haloperidol lactate, ondansetron **OR** ondansetron (ZOFRAN) IV

## 2022-07-18 NOTE — Progress Notes (Signed)
[facility]                                                                               [name], is a 66 y.o. male, DOB - Mar 08, 1956, [id number] Admit date - 07/16/2022    Outpatient Primary MD for the patient is [name], MD  LOS - 2  days    Brief summary     Assessment & Plan   [name] is a 66 y.o. male with medical history significant of advanced early Alzheimer's dementia, anxiety, hypertension, hyperlipidemia who is brought from his facility due to fever for the past 2 days associated with decreased level of consciousness.  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  No drainable abscess observed.  Left lower lobe infiltrate and small left pleural effusion.  There is mucus in the trachea and both mainstem bronchi nearly completely filling the bronchus intermedius and plugging the right lower lobe bronchi.    Assessment and Plan:  Sepsis secondary to infected sacro coccygeal decubitus ulcer with coccygeal OM and left lower lobe pneumonia.  Sepsis physiology improving.  Currently requiring about 2 to 3 lit of [place] oxygen.  Started him on IV vancomycin and IV cefepime.  Follow up blood cultures. Follow pro calcitonin.  COVID OCR and influenza PCR is negative.  Urine for strep pneumonia  is negative and legionella antigen ordered and pending.  Sputum cultures ordered, if able to obtain.  Will get ID consult  for the duration of the antibiotics.  Improving leukocytosis.   Acute metabolic encephalopathy in the setting of severe Alzheimer dementia.  - from AKI, Uremia and hypernatremia and sepsis from OM of the coccygeal area and LLLpneumonia.  - TSH , vit b12 and folic acid level are unremarkable.  Slp evaluation ordered and recommended dysphagia 1 diet.    AKI with AG metabolic acidosis:  Suspect from poor oral intake and sepsis.  Nephrology on  board and unfortunately patient is not a candidate for HD.  Creatinine improved to 3.32 to 1.94 with IV fluids.  Foley catheter placed for measuring urine output.    Hypernatremia: from free fluid deficit.  Admitted with a sodium of 167 , improved to 156 tp 146.  Follow up BMP tonight and in am.     Hypokalemia:  Replaced.    Alzheimer's Dementia:  Started 9 years ago and has been bed bound for the last 2 years.   Normocytic Anemia: baseline hemoglobin 3 years ago was around 12.  Hemoglobin slowly dropping, 14( hemo concentrated sample from dehydration ) to   10.3to 9.3 . Continue to monitor.  Anemia panel reviewed.  Suspect drop of hemoglobin from hemodilution.     Mild thrombocytopenia:  From sepsis?   Elevated transaminases:  - suspect form hypotension, sepsis. Liver enzymes are improving.     In view of advanced dementia, poor functional status, palliative care consulted for goals of care. Wife is requesting for possible hospice to follow up at the SNF.      Pressure injury:  RN Pressure Injury Documentation: Pressure Injury 07/16/22 Sacrum Medial Unstageable - Full thickness tissue loss  in which the base of the injury is covered by slough (yellow, tan, gray, green or brown) and/or eschar (tan, brown or black) in the wound bed. Sloughs, eschar, drainage and o (Active)  07/16/22 2209  Location: Sacrum  Location Orientation: Medial  Staging: Unstageable - Full thickness tissue loss in which the base of the injury is covered by slough (yellow, tan, gray, green or brown) and/or eschar (tan, brown or black) in the wound bed.  Wound Description (Comments): Sloughs, eschar, drainage and odor  Present on Admission: Yes  Dressing Type Foam - Lift dressing to assess site every shift 07/17/22 2004     Pressure Injury 07/17/22 Heel Left Deep Tissue Pressure Injury - Purple or maroon localized area of discolored intact skin or blood-filled blister due to damage of underlying  soft tissue from pressure and/or shear. (Active)  07/17/22 0800  Location: Heel  Location Orientation: Left  Staging: Deep Tissue Pressure Injury - Purple or maroon localized area of discolored intact skin or blood-filled blister due to damage of underlying soft tissue from pressure and/or shear.  Wound Description (Comments):   Present on Admission:    Wound care consulted.   Estimated body mass index is 22.1 kg/m as calculated from the following:   Height as of this encounter: 5\' 6"  (1.676 m).   Weight as of this encounter: 62.1 kg.  Code Status: DNR DVT Prophylaxis:  heparin injection 5,000 Units Start: 07/16/22 2200   Level of Care: Level of care: Progressive Family Communication: Updated patient's wife at bedside.   Disposition Plan:     Remains inpatient appropriate:  IV antibiotics.   Procedures:  NONE  Consultants:   Palliative care Nephrology.   Antimicrobials:   Anti-infectives (From admission, onward)    Start     Dose/Rate Route Frequency Ordered Stop   07/18/22 1200  vancomycin (VANCOREADY) IVPB 750 mg/150 mL        750 mg 150 mL/hr over 60 Minutes Intravenous Every 24 hours 07/18/22 1119     07/18/22 1100  ceFEPIme (MAXIPIME) 2 g in sodium chloride 0.9 % 100 mL IVPB        2 g 200 mL/hr over 30 Minutes Intravenous Every 12 hours 07/18/22 1002     07/17/22 1803  vancomycin variable dose per unstable renal function (pharmacist dosing)         Does not apply See admin instructions 07/17/22 1803     07/16/22 2000  ceFEPIme (MAXIPIME) 2 g in sodium chloride 0.9 % 100 mL IVPB  Status:  Discontinued        2 g 200 mL/hr over 30 Minutes Intravenous Every 24 hours 07/16/22 1738 07/18/22 1002   07/16/22 1738  vancomycin variable dose per unstable renal function (pharmacist dosing)  Status:  Discontinued         Does not apply See admin instructions 07/16/22 1738 07/17/22 1710   07/16/22 1215  vancomycin (VANCOREADY) IVPB 1250 mg/250 mL        1,250 mg 166.7  mL/hr over 90 Minutes Intravenous  Once 07/16/22 1134 07/17/22 1725   07/16/22 1130  piperacillin-tazobactam (ZOSYN) IVPB 3.375 g        3.375 g 100 mL/hr over 30 Minutes Intravenous  Once 07/16/22 1127 07/16/22 1234        Medications  Scheduled Meds:  Chlorhexidine Gluconate Cloth  6 each Topical Daily   heparin  5,000 Units Subcutaneous Q8H   senna-docusate  2 tablet Oral BID   sodium hypochlorite  Irrigation BID   vancomycin variable dose per unstable renal function (pharmacist dosing)   Does not apply See admin instructions   Continuous Infusions:  sodium chloride 65 mL/hr at 07/18/22 1254   ceFEPime (MAXIPIME) IV 2 g (07/18/22 1116)   dextrose     vancomycin 750 mg (07/18/22 1256)   PRN Meds:.acetaminophen **OR** acetaminophen, haloperidol lactate, ondansetron **OR** ondansetron (ZOFRAN) IV    Subjective:   [name] was seen and examined today. Clammy today, lethargic. No family at bedside. Low grade temp.   Objective:   Vitals:   07/17/22 2153 07/17/22 2359 07/18/22 0502 07/18/22 1322  BP: (!) 96/53 103/62 106/71 102/70  Pulse: 91 92  84  Resp: (!) 21 (!) 21 20 16   Temp: 98.7 F (37.1 C) 99 F (37.2 C) 99.4 F (37.4 C) 99.3 F (37.4 C)  TempSrc: Axillary Axillary Axillary Axillary  SpO2: 99% 99% 97% 100%  Weight:      Height:        Intake/Output Summary (Last 24 hours) at 07/18/2022 1828 Last data filed at 07/18/2022 1756 Gross per 24 hour  Intake 5646.63 ml  Output 3480 ml  Net 2166.63 ml    Filed Weights   07/16/22 1544  Weight: 62.1 kg     Exam General exam: lethargic, ill appearing, not in distress.  Respiratory system: Clear to auscultation. Respiratory effort normal. Cardiovascular system: S1 & S2 heard, RRR. No JVD. No pedal edema. Gastrointestinal system: Abdomen is nondistended, soft and nontender.  Normal bowel sounds heard. Central nervous system: lethargic, does not follow commands at baseline, non verbal.  Extremities:  Symmetric 5 x 5 power. Skin: stage 4 decubitus ulcer Psychiatry: unable to assess.      Data Reviewed:  I have personally reviewed following labs and imaging studies   CBC Lab Results  Component Value Date   WBC 12.9 (H) 07/18/2022   RBC 3.28 (L) 07/18/2022   RBC 3.23 (L) 07/18/2022   HGB 9.3 (L) 07/18/2022   HCT 30.6 (L) 07/18/2022   MCV 93.3 07/18/2022   MCH 28.4 07/18/2022   PLT 152 07/18/2022   MCHC 30.4 07/18/2022   RDW 14.4 07/18/2022   LYMPHSABS 0.9 07/18/2022   MONOABS 0.4 07/18/2022   EOSABS 0.2 07/18/2022   BASOSABS 0.0 07/18/2022     Last metabolic panel Lab Results  Component Value Date   NA 146 (H) 07/18/2022   K 3.1 (L) 07/18/2022   CL 123 (H) 07/18/2022   CO2 20 (L) 07/18/2022   BUN 47 (H) 07/18/2022   CREATININE 1.94 (H) 07/18/2022   GLUCOSE 140 (H) 07/18/2022   GFRNONAA 38 (L) 07/18/2022   GFRAA >60 02/04/2020   CALCIUM 7.2 (L) 07/18/2022   PHOS 2.3 (L) 07/18/2022   PROT 4.6 (L) 07/18/2022   ALBUMIN 1.8 (L) 07/18/2022   BILITOT 0.4 07/18/2022   ALKPHOS 42 07/18/2022   AST 177 (H) 07/18/2022   ALT 71 (H) 07/18/2022   ANIONGAP 3 (L) 07/18/2022    CBG (last 3)  Recent Labs    07/18/22 0713 07/18/22 1136 07/18/22 1734  GLUCAP 118* 113* 90       Coagulation Profile: Recent Labs  Lab 07/16/22 1126  INR 1.4*      Radiology Studies: No results found.     13/03/23 M.D. [facility] 07/18/2022, 6:28 PM  Available via Epic secure chat 7am-7pm After 7 pm, please refer to night coverage provider listed on amion.

## 2022-07-18 NOTE — Progress Notes (Signed)
Pharmacy Antibiotic Note  [name] is a 66 y.o. male with sacral ulcer who presented to the ED from [facility] facility on 07/16/2022 with fever and AMS. Of note, pt was on ceftriaxone IM for 3 days PTA. 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  No drainable abscess observed. Left lower lobe airspace opacity and small left pleural effusion. Left lower lobe pneumonia is a distinct possibility." Pharmacy has been consulted to dose vancomycin and cefepime for infection.  Plan: Cefepime 2g IV q12h Vancomycin 750 mg IV q24h (SCr 1.94, TBW < IBW, est AUC 511) Measure Vanc levels as needed.  Goal AUC = 400 - 550 Follow up renal function, culture results, and clinical course.   _______________________________________  Height: 5\' 6"  (167.6 cm) (per record from [place]. Noted to be height on 10/02/19) Weight: 62.1 kg (136 lb 14.4 oz) IBW/kg (Calculated) : 63.8  Temp (24hrs), Avg:98.7 F (37.1 C), Min:97.8 F (36.6 C), Max:99.4 F (37.4 C)  Recent Labs  Lab 07/16/22 1126 07/17/22 0621 07/17/22 1602 07/18/22 0428  WBC 17.0* 12.8*  --  12.9*  CREATININE 4.32* 4.02* 3.32* 1.94*     Estimated Creatinine Clearance: 34.2 mL/min (A) (by C-G formula based on SCr of 1.94 mg/dL (H)).    No Known Allergies  Antimicrobials this admission:  PTA 10/31 CTX 1 gm IM q24 x 3 days> 11/2 11/3 zosyn x1 11/3 vanc>> 11/3 cefepime>>  Dose adjustments this admission:  11/5 Cefepime renally adjusted 11/5 1000 Vanc rand (~44 hours after last dose): 8, restart scheduled vanc per AUC dosing protocol.   Microbiology results:  11/3 BCx2: ngtd 11/3 UCx: NGF 11/4 MRSA PCR: not detected   Thank you for allowing pharmacy to be a part of this patient's care.  Gretta Arab PharmD, BCPS [facility] main pharmacy [phone] 07/18/2022 11:18 AM

## 2022-07-19 ENCOUNTER — Encounter (HOSPITAL_COMMUNITY): Payer: Self-pay | Admitting: General Surgery

## 2022-07-19 DIAGNOSIS — J690 Pneumonitis due to inhalation of food and vomit: Secondary | ICD-10-CM | POA: Diagnosis not present

## 2022-07-19 DIAGNOSIS — G300 Alzheimer's disease with early onset: Secondary | ICD-10-CM

## 2022-07-19 DIAGNOSIS — M869 Osteomyelitis, unspecified: Secondary | ICD-10-CM

## 2022-07-19 DIAGNOSIS — M4628 Osteomyelitis of vertebra, sacral and sacrococcygeal region: Secondary | ICD-10-CM | POA: Insufficient documentation

## 2022-07-19 DIAGNOSIS — L89154 Pressure ulcer of sacral region, stage 4: Secondary | ICD-10-CM

## 2022-07-19 DIAGNOSIS — A419 Sepsis, unspecified organism: Secondary | ICD-10-CM | POA: Diagnosis not present

## 2022-07-19 DIAGNOSIS — N179 Acute kidney failure, unspecified: Secondary | ICD-10-CM | POA: Diagnosis not present

## 2022-07-19 DIAGNOSIS — M8638 Chronic multifocal osteomyelitis, other site: Secondary | ICD-10-CM | POA: Diagnosis not present

## 2022-07-19 DIAGNOSIS — F02C0 Dementia in other diseases classified elsewhere, severe, without behavioral disturbance, psychotic disturbance, mood disturbance, and anxiety: Secondary | ICD-10-CM

## 2022-07-19 LAB — COMPREHENSIVE METABOLIC PANEL
ALT: 66 U/L — ABNORMAL HIGH (ref 0–44)
AST: 109 U/L — ABNORMAL HIGH (ref 15–41)
Albumin: 1.8 g/dL — ABNORMAL LOW (ref 3.5–5.0)
Alkaline Phosphatase: 48 U/L (ref 38–126)
Anion gap: 5 (ref 5–15)
BUN: 19 mg/dL (ref 8–23)
CO2: 20 mmol/L — ABNORMAL LOW (ref 22–32)
Calcium: 7.4 mg/dL — ABNORMAL LOW (ref 8.9–10.3)
Chloride: 115 mmol/L — ABNORMAL HIGH (ref 98–111)
Creatinine, Ser: 0.73 mg/dL (ref 0.61–1.24)
GFR, Estimated: >60 mL/min (ref >60)
Glucose, Bld: 109 mg/dL — ABNORMAL HIGH (ref 70–99)
Potassium: 3.1 mmol/L — ABNORMAL LOW (ref 3.5–5.1)
Sodium: 140 mmol/L (ref 135–145)
Total Bilirubin: 0.7 mg/dL (ref 0.3–1.2)
Total Protein: 4.7 g/dL — ABNORMAL LOW (ref 6.5–8.1)

## 2022-07-19 LAB — GLUCOSE, CAPILLARY
Glucose-Capillary: 82 mg/dL (ref 70–99)
Glucose-Capillary: 100 mg/dL — ABNORMAL HIGH (ref 70–99)
Glucose-Capillary: 101 mg/dL — ABNORMAL HIGH (ref 70–99)

## 2022-07-19 LAB — CBC WITH DIFFERENTIAL/PLATELET
Abs Immature Granulocytes: 0.24 10*3/uL — ABNORMAL HIGH (ref 0.00–0.07)
Basophils Absolute: 0.1 10*3/uL (ref 0.0–0.1)
Basophils Relative: 0 %
Eosinophils Absolute: 0.2 10*3/uL (ref 0.0–0.5)
Eosinophils Relative: 2 %
HCT: 28.6 % — ABNORMAL LOW (ref 39.0–52.0)
Hemoglobin: 9.2 g/dL — ABNORMAL LOW (ref 13.0–17.0)
Immature Granulocytes: 2 %
Lymphocytes Relative: 6 %
Lymphs Abs: 0.7 10*3/uL (ref 0.7–4.0)
MCH: 28.8 pg (ref 26.0–34.0)
MCHC: 32.2 g/dL (ref 30.0–36.0)
MCV: 89.4 fL (ref 80.0–100.0)
Monocytes Absolute: 0.4 10*3/uL (ref 0.1–1.0)
Monocytes Relative: 3 %
Neutro Abs: 9.6 10*3/uL — ABNORMAL HIGH (ref 1.7–7.7)
Neutrophils Relative %: 87 %
Platelets: 158 10*3/uL (ref 150–400)
RBC: 3.2 MIL/uL — ABNORMAL LOW (ref 4.22–5.81)
RDW: 14 % (ref 11.5–15.5)
WBC: 11.2 10*3/uL — ABNORMAL HIGH (ref 4.0–10.5)
nRBC: 0.2 % (ref 0.0–0.2)

## 2022-07-19 MED ORDER — ADULT MULTIVITAMIN W/MINERALS CH
1.0000 | ORAL_TABLET | Freq: Every day | ORAL | Status: DC
  Administered 2022-07-19 – 2022-07-20 (×2): 1 via ORAL
  Filled 2022-07-19 (×2): qty 1

## 2022-07-19 MED ORDER — POTASSIUM CHLORIDE CRYS ER 20 MEQ PO TBCR
40.0000 meq | EXTENDED_RELEASE_TABLET | Freq: Once | ORAL | Status: AC
  Administered 2022-07-19: 40 meq via ORAL
  Filled 2022-07-19: qty 2

## 2022-07-19 MED ORDER — SODIUM CHLORIDE 0.9 % IV SOLN
2.0000 g | Freq: Three times a day (TID) | INTRAVENOUS | Status: DC
  Administered 2022-07-19 – 2022-07-20 (×3): 2 g via INTRAVENOUS
  Filled 2022-07-19 (×3): qty 12.5

## 2022-07-19 MED ORDER — VANCOMYCIN HCL 1500 MG/300ML IV SOLN
1500.0000 mg | INTRAVENOUS | Status: DC
  Administered 2022-07-19 – 2022-07-21 (×3): 1500 mg via INTRAVENOUS
  Filled 2022-07-19 (×3): qty 300

## 2022-07-19 MED ORDER — VANCOMYCIN HCL IN DEXTROSE 1-5 GM/200ML-% IV SOLN: 1000.0000 mg | INTRAVENOUS | Status: DC

## 2022-07-19 NOTE — Progress Notes (Addendum)
Pharmacy Antibiotic Note  [name] is a 66 y.o. male with sacral ulcer who presented to the ED from [facility] facility on 07/16/2022 with fever and AMS. Of note, pt was on ceftriaxone IM for 3 days PTA. 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  No drainable abscess observed. Left lower lobe airspace opacity and small left pleural effusion. Left lower lobe pneumonia is a distinct possibility." Pharmacy has been consulted to dose vancomycin and cefepime for infection. ID to be consulted per MD note.  Plan: Increase Cefepime 2g IV q8h Increase Vancomycin 1500g IV q24h (SCr rounded up to 0.8, TBW < IBW, est AUC 459) Measure Vanc levels as needed.  Goal AUC = 400 - 550 Follow up renal function, culture results, and clinical course.   Height: 5\' 6"  (167.6 cm) (per record from [place]. Noted to be height on 10/02/19) Weight: 62.1 kg (136 lb 14.4 oz) IBW/kg (Calculated) : 63.8  Temp (24hrs), Avg:99.6 F (37.6 C), Min:99.3 F (37.4 C), Max:100.1 F (37.8 C)  Recent Labs  Lab 07/16/22 1126 07/17/22 0621 07/17/22 1602 07/18/22 0428 07/18/22 1012 07/18/22 2006 07/19/22 0727  WBC 17.0* 12.8*  --  12.9*  --   --  11.2*  CREATININE 4.32* 4.02* 3.32* 1.94*  --  1.21  --   VANCORANDOM  --   --   --   --  8  --   --      Estimated Creatinine Clearance: 54.9 mL/min (by C-G formula based on SCr of 1.21 mg/dL).    No Known Allergies  Antimicrobials this admission:  PTA 10/31 CTX 1 gm IM q24 x 3 days> 11/2 11/3 zosyn x1 11/3 vanc>> 11/3 cefepime>>   Dose adjustments this admission:  11/5 Cefepime renally adjusted 11/5 1000 Vanc rand (~44 hours after last dose): 8, restart scheduled vanc per AUC dosing protocol.  11/6 increase vanc 1g q24h for improved SCr   Microbiology results:  11/3 BCx2: ngtd 11/3 UCx: NGF 11/4 MRSA PCR: not detected 11/4 Urine strep Ag: neg 11/4 Urine legionella Ag: IP  Thank you for allowing  pharmacy to be a part of this patient's care.  Peggyann Juba, PharmD, BCPS [facility] main pharmacy [phone] 07/19/2022 7:56 AM

## 2022-07-19 NOTE — TOC Initial Note (Signed)
Transition of Care [facility]) - Initial/Assessment Note    Patient Details  Name: [name] MRN: [id number] Date of Birth: 07-28-1956  Transition of Care [name]) CM/SW Contact:    Marvin Phi, RN Phone Number: 07/19/2022, 11:11 AM  Clinical Narrative: From [name] to return-confirmed w/rep [name], & spouse [name].                  Expected Discharge Plan: [facility] Barriers to Discharge: Continued Medical Work up   Patient Goals and CMS Choice Patient states their goals for this hospitalization and ongoing recovery are::  (Return [name]) CMS Medicare.gov Compare Post Acute Care list provided to:: Patient Represenative (must comment) ([name](spouse)) Choice offered to / list presented to : Spouse  Expected Discharge Plan and Services Expected Discharge Plan: [place]   Discharge Planning Services: CM Consult                                          Prior Living Arrangements/Services   Lives with:: Facility Resident Patient language and need for interpreter reviewed:: Yes Do you feel safe going back to the place where you live?: Yes      Need for Family Participation in Patient Care: Yes (Comment) Care giver support system in place?: Yes (comment)   Criminal Activity/Legal Involvement Pertinent to Current Situation/Hospitalization: No - Comment as needed  Activities of Daily Living Home Assistive Devices/Equipment: [employer] fast streamer, Wheelchair ADL Screening (condition at time of admission) Patient's cognitive ability adequate to safely complete daily activities?: No Is the patient deaf or have difficulty hearing?: No Does the patient have difficulty seeing, even when wearing glasses/contacts?: Yes (has not worn his glasses in the past couple of years) Does the patient have difficulty concentrating, remembering, or making decisions?: Yes (early onset Alzhiemer's x 9 years per wife) Patient able to express need for assistance  with ADLs?: No Does the patient have difficulty dressing or bathing?: Yes Independently performs ADLs?: No Communication: Needs assistance Is this a change from baseline?: Pre-admission baseline Dressing (OT): Needs assistance Is this a change from baseline?: Pre-admission baseline Grooming: Needs assistance Is this a change from baseline?: Pre-admission baseline Feeding: Needs assistance Is this a change from baseline?: Pre-admission baseline Bathing: Needs assistance Is this a change from baseline?: Pre-admission baseline Toileting: Needs assistance Is this a change from baseline?: Pre-admission baseline In/Out Bed: Needs assistance Is this a change from baseline?: Pre-admission baseline Walks in Home: Dependent Is this a change from baseline?: Pre-admission baseline Does the patient have difficulty walking or climbing stairs?: Yes Weakness of Legs: Both Weakness of Arms/Hands: Both  Permission Sought/Granted Permission sought to share information with : [name] Permission granted to share information with : Yes, Verbal Permission Granted  Share Information with NAME:  ([name])           Emotional Assessment Appearance:: Appears stated age Attitude/Demeanor/Rapport: Gracious Affect (typically observed): Accepting Orientation: : Oriented to Self Alcohol / Substance Use: Not Applicable Psych Involvement: No (comment)  Admission diagnosis:  Stage IV pressure ulcer of sacral region [name]) [L89.154] Sepsis due to undetermined organism ([place]) [A41.9] Osteomyelitis of coccyx ([name]) [M86.9] Aspiration pneumonia of both lower lobes, unspecified aspiration pneumonia type ([name]) [J69.0] Sepsis, due to unspecified organism, unspecified whether acute organ dysfunction present [name]) [A41.9] Patient Active Problem List   Diagnosis Date Noted   Osteomyelitis ([place]) 07/19/2022  Aspiration pneumonia of both lower lobes (HCC) 07/19/2022   Sepsis (HCC) 07/19/2022   Sepsis due to  undetermined organism (HCC) 07/16/2022   Hypernatremia 07/16/2022   AKI (acute kidney injury) (HCC) 07/16/2022   Leukocytosis 07/16/2022   Stage IV pressure ulcer of sacral region (HCC) 07/16/2022   Alzheimer's dementia (HCC) 07/16/2022   Restlessness 08/24/2018   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 AT [address] OF [address] [place] [address] Phone: [phone] Fax: [phone]  [facility] [place], [place] - [address] [place] [address] [place] [address] Phone: [phone] Fax: [phone]     Social Determinants of Health (SDOH) Interventions    Readmission Risk Interventions     No data to display

## 2022-07-19 NOTE — Progress Notes (Signed)
Initial Nutrition Assessment  DOCUMENTATION CODES:  Not applicable  INTERVENTION:  -Continue diet per SLP recommendations: dysphagia 1 w/ NTL -Provide Mighty Shake TID (330kcal and 9g protein) -Provide MVI daily -Recommend checking Zn and Vitamin C with next labs and supplementing prn -Offer feeding assistance as needed  NUTRITION DIAGNOSIS:  Increased nutrient needs related to wound healing as evidenced by estimated needs (increased metabolic need for healing).  GOAL:  Patient will meet greater than or equal to 90% of their needs  MONITOR:  PO intake, Supplement acceptance  REASON FOR ASSESSMENT:  Consult Assessment of nutrition requirement/status  ASSESSMENT:  Pt is a 66yo M with PMH of advanced early Alzheimer's dementia, anxiety, HTN and HLD who presnts with fever x2 days PTA associated with decreased level of consciousness.  Multiple attempts to visit pt today but sleeping or with other staff. No family present to provide history. Per chart review, pt with a stage 4 PI on sacrum. He has been eating well during admission, accepting 75-100% of dysphagia 1 diet w/ NTL. Recommend also providing Mighty Shakes TID (330kcal, 9g protein/shake) to increase protein and calorie intake for wound healing. Due to swallowing difficulty, will reduce oral vitamin/mineral supplements with just daily MVI. Recommend checking Vitamin C and Zn and supplementing prn. Juven not appropriate due to need for thickened liquids.   Medications reviewed and include: senna-docusate, cefepime, D5W @125mL /hf, vancomycin, prn tylenol, prn zofran  Labs reviewed: Na:140 (from 167 on admit), K:3.1, BG:109, Corrected Ca:8.8 (Ca:7.4, Albumin:1.8), AST:109, ALT:66, GFR>60, [id number]   NUTRITION - FOCUSED PHYSICAL EXAM: Deferred  Diet Order:   Diet Order             DIET - DYS 1 Room service appropriate? Yes; Fluid consistency: Nectar Thick  Diet effective now                   EDUCATION NEEDS:   Not  appropriate for education at this time  Skin:  Skin Assessment: Skin Integrity Issues: Skin Integrity Issues:: Stage IV, DTI DTI: on heel Stage IV: on sacrum per WOCN  Last BM:     Height:   Ht Readings from Last 1 Encounters:  07/16/22 5\' 6"  (1.676 m)    Weight:   Wt Readings from Last 1 Encounters:  07/16/22 62.1 kg    Ideal Body Weight:     BMI:  Body mass index is 22.1 kg/m.  Estimated Nutritional Needs:   Kcal:  1860-2175kcal  Protein:  90-125g (1.5-2.0g/kg)  Fluid:  1860-2163mL  Candise Bowens, MS, RD, LDN, CNSC See AMiON for contact information

## 2022-07-19 NOTE — Progress Notes (Signed)
[facility]                                                                               [name], is a 66 y.o. male, DOB - 06/29/1956, LFY:[id number] Admit date - 07/16/2022    Outpatient Primary MD for the patient is [name], MD  LOS - 3  days    Brief summary     Assessment & Plan   [name] is a 66 y.o. male with medical history significant of advanced early Alzheimer's dementia, anxiety, hypertension, hyperlipidemia who is brought from his facility due to fever for the past 2 days associated with decreased level of consciousness.  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  No drainable abscess observed.  Left lower lobe infiltrate and small left pleural effusion.  There is mucus in the trachea and both mainstem bronchi nearly completely filling the bronchus intermedius and plugging the right lower lobe bronchi.   He was admitted for sepsis secondary to infected sacro coccygeal decubitus ulcer and coccygeal OM and LLL pneumonia, along with AKI.  He was started on broad spectrum IV antibiotics, general surgery, ID and nephrology consulted for recommendations.    Assessment and Plan:  Sepsis secondary to infected sacro coccygeal decubitus ulcer with coccygeal OM and left lower lobe pneumonia.  Sepsis physiology has improved.  Currently requiring about 2 to 3 lit of [place] oxygen.  Started him on IV vancomycin and IV cefepime.  Follow up blood cultures. Follow pro calcitonin.  COVID PCR and influenza PCR is negative.  Urine for strep pneumonia  is negative and legionella antigen ordered and pending.  Sputum cultures ordered, if able to obtain.  Surgery consulted to see if he needs debridement.  Requested ID consult  for the duration of the antibiotics.  Improving leukocytosis but febrile this afternoon. Will check blood cultures again today.   Acute  metabolic encephalopathy in the setting of severe Alzheimer dementia.  - from AKI, Uremia and hypernatremia and sepsis from OM of the coccygeal area and LLLpneumonia.  - TSH , vit b12 and folic acid level are unremarkable.  Slp evaluation ordered and recommended dysphagia 1 diet.  - patient's mental status appears to be at baseline as per the wife.    AKI with AG metabolic acidosis:  Suspect from poor oral intake and sepsis.  Nephrology on board and unfortunately patient is not a candidate for HD.  Creatinine improved to 3.32 to 1.94 to back to baseline with IV fluids.  Foley catheter placed for measuring urine output.    Hypernatremia: from free fluid deficit.  Admitted with a sodium of 167 , improved to 156 tp 146 to 140.  Dextrose fluids discontinued.     Hypokalemia:  Replaced. Recheck in am along with  magnesium and phos.    Alzheimer's Dementia:  Started 9 years ago and has been bed bound for the last 2 years.   Normocytic Anemia: baseline hemoglobin 3 years ago was around 12.  Hemoglobin slowly dropping, 14( hemo concentrated sample from dehydration ) to   10.3to 9.3 . Continue to monitor.  Anemia panel reviewed.  Suspect drop of hemoglobin from hemodilution.   Looks like his new baseline hemoglobin appears to be around 9.    Mild thrombocytopenia:  From sepsis? Much improved.    Elevated transaminases:  - suspect form hypotension, sepsis. Liver enzymes are improving.  - recheck liver panel in one week.     In view of advanced dementia, poor functional status, palliative care consulted for goals of care. Wife is requesting for possible hospice to follow up at the SNF.      Pressure injury:  RN Pressure Injury Documentation: Pressure Injury 07/16/22 Sacrum Medial Unstageable - Full thickness tissue loss in which the base of the injury is covered by slough (yellow, tan, gray, green or brown) and/or eschar (tan, brown or black) in the wound bed. Sloughs, eschar,  drainage and o (Active)  07/16/22 2209  Location: Sacrum  Location Orientation: Medial  Staging: Unstageable - Full thickness tissue loss in which the base of the injury is covered by slough (yellow, tan, gray, green or brown) and/or eschar (tan, brown or black) in the wound bed.  Wound Description (Comments): Sloughs, eschar, drainage and odor  Present on Admission: Yes  Dressing Type Foam - Lift dressing to assess site every shift 07/19/22 0945     Pressure Injury 07/17/22 Heel Left Deep Tissue Pressure Injury - Purple or maroon localized area of discolored intact skin or blood-filled blister due to damage of underlying soft tissue from pressure and/or shear. (Active)  07/17/22 0800  Location: Heel  Location Orientation: Left  Staging: Deep Tissue Pressure Injury - Purple or maroon localized area of discolored intact skin or blood-filled blister due to damage of underlying soft tissue from pressure and/or shear.  Wound Description (Comments):   Present on Admission:    Wound care consulted.   Estimated body mass index is 22.1 kg/m as calculated from the following:   Height as of this encounter: 5\' 6"  (1.676 m).   Weight as of this encounter: 62.1 kg.  Code Status: DNR DVT Prophylaxis:  heparin injection 5,000 Units Start: 07/16/22 2200   Level of Care: Level of care: Progressive Family Communication: Updated patient's wife at bedside.   Disposition Plan:     Remains inpatient appropriate:  IV antibiotics.   Procedures:  NONE  Consultants:   Palliative care Nephrology.   Antimicrobials:   Anti-infectives (From admission, onward)    Start     Dose/Rate Route Frequency Ordered Stop   07/19/22 1800  ceFEPIme (MAXIPIME) 2 g in sodium chloride 0.9 % 100 mL IVPB        2 g 200 mL/hr over 30 Minutes Intravenous Every 8 hours 07/19/22 1155     07/19/22 1230  vancomycin (VANCOREADY) IVPB 1500 mg/300 mL        1,500 mg 150 mL/hr over 120 Minutes Intravenous Every 24 hours  07/19/22 1154     07/19/22 1200  vancomycin (VANCOCIN) IVPB 1000 mg/200 mL premix  Status:  Discontinued        1,000 mg 200 mL/hr over 60 Minutes Intravenous Every 24 hours 07/19/22 0754 07/19/22 1154   07/18/22 1200  vancomycin (VANCOREADY) IVPB 750 mg/150 mL  Status:  Discontinued        750 mg 150 mL/hr over 60 Minutes Intravenous Every 24 hours 07/18/22 1119 07/19/22 0754   07/18/22 1100  ceFEPIme (MAXIPIME) 2 g in sodium chloride 0.9 % 100 mL IVPB  Status:  Discontinued        2 g 200 mL/hr over 30  Minutes Intravenous Every 12 hours 07/18/22 1002 07/19/22 1155   07/17/22 1803  vancomycin variable dose per unstable renal function (pharmacist dosing)  Status:  Discontinued         Does not apply See admin instructions 07/17/22 1803 07/19/22 0710   07/16/22 2000  ceFEPIme (MAXIPIME) 2 g in sodium chloride 0.9 % 100 mL IVPB  Status:  Discontinued        2 g 200 mL/hr over 30 Minutes Intravenous Every 24 hours 07/16/22 1738 07/18/22 1002   07/16/22 1738  vancomycin variable dose per unstable renal function (pharmacist dosing)  Status:  Discontinued         Does not apply See admin instructions 07/16/22 1738 07/17/22 1710   07/16/22 1215  vancomycin (VANCOREADY) IVPB 1250 mg/250 mL        1,250 mg 166.7 mL/hr over 90 Minutes Intravenous  Once 07/16/22 1134 07/17/22 1725   07/16/22 1130  piperacillin-tazobactam (ZOSYN) IVPB 3.375 g        3.375 g 100 mL/hr over 30 Minutes Intravenous  Once 07/16/22 1127 07/16/22 1234        Medications  Scheduled Meds:  Chlorhexidine Gluconate Cloth  6 each Topical Daily   heparin  5,000 Units Subcutaneous Q8H   multivitamin with minerals  1 tablet Oral Daily   senna-docusate  2 tablet Oral BID   sodium hypochlorite   Irrigation BID   Continuous Infusions:  sodium chloride 65 mL/hr at 07/18/22 1254   ceFEPime (MAXIPIME) IV     dextrose 125 mL/hr at 07/19/22 0704   vancomycin 1,500 mg (07/19/22 1248)   PRN Meds:.acetaminophen **OR**  acetaminophen, haloperidol lactate, ondansetron **OR** ondansetron (ZOFRAN) IV    Subjective:   [name] was seen and examined today.  No events overnight.   Objective:   Vitals:   07/18/22 1322 07/18/22 2032 07/19/22 0617 07/19/22 1509  BP: 102/70 111/70 (!) 110/91 104/63  Pulse: 84 88 96 98  Resp: 16 19 20 18   Temp: 99.3 F (37.4 C) 99.4 F (37.4 C) 100.1 F (37.8 C) (!) 100.9 F (38.3 C)  TempSrc: Axillary Axillary Oral Oral  SpO2: 100% 97% 99% 100%  Weight:      Height:        Intake/Output Summary (Last 24 hours) at 07/19/2022 1516 Last data filed at 07/19/2022 1400 Gross per 24 hour  Intake 8183.96 ml  Output 2850 ml  Net 5333.96 ml    Filed Weights   07/16/22 1544  Weight: 62.1 kg     Exam General exam: Ill appearing gentleman, not in distress  Respiratory system: diminished air entry at bases, on 2 lit OF [place] oxygen.  Cardiovascular system: S1 & S2 heard, RRR. Marland Kitchen No pedal edema. Gastrointestinal system: Abdomen is nondistended, soft and nontender.  Central nervous system: non verbal, opens eyes with tactile stimulation,  Extremities: no cyanosis or clubbing.  Skin: deep tissue injury on the back.  Psychiatry: cannot be assessed due to advanced dementia.       Data Reviewed:  I have personally reviewed following labs and imaging studies   CBC Lab Results  Component Value Date   WBC 11.2 (H) 07/19/2022   RBC 3.20 (L) 07/19/2022   HGB 9.2 (L) 07/19/2022   HCT 28.6 (L) 07/19/2022   MCV 89.4 07/19/2022   MCH 28.8 07/19/2022   PLT 158 07/19/2022   MCHC 32.2 07/19/2022   RDW 14.0 07/19/2022   LYMPHSABS 0.7 07/19/2022   MONOABS 0.4 07/19/2022   EOSABS  0.2 07/19/2022   BASOSABS 0.1 07/19/2022     Last metabolic panel Lab Results  Component Value Date   NA 140 07/19/2022   K 3.1 (L) 07/19/2022   CL 115 (H) 07/19/2022   CO2 20 (L) 07/19/2022   BUN 19 07/19/2022   CREATININE 0.73 07/19/2022   GLUCOSE 109 (H) 07/19/2022   GFRNONAA >60  07/19/2022   GFRAA >60 02/04/2020   CALCIUM 7.4 (L) 07/19/2022   PHOS 2.3 (L) 07/18/2022   PROT 4.7 (L) 07/19/2022   ALBUMIN 1.8 (L) 07/19/2022   BILITOT 0.7 07/19/2022   ALKPHOS 48 07/19/2022   AST 109 (H) 07/19/2022   ALT 66 (H) 07/19/2022   ANIONGAP 5 07/19/2022    CBG (last 3)  Recent Labs    07/18/22 2350 07/19/22 0603 07/19/22 1152  GLUCAP 71 82 100*       Coagulation Profile: Recent Labs  Lab 07/16/22 1126  INR 1.4*      Radiology Studies: No results found.     Kathlen Mody M.D. [facility] 07/19/2022, 3:16 PM  Available via Epic secure chat 7am-7pm After 7 pm, please refer to night coverage provider listed on amion.

## 2022-07-19 NOTE — [place] FL2 (Signed)
[place] MEDICAID FL2 LEVEL OF CARE SCREENING TOOL     IDENTIFICATION  Patient Name: [name] Birthdate: 1956-04-02 Sex: male Admission Date (Current Location): 07/16/2022  [facility] and [place] Number:  Producer, television/film/video and Address:  [facility],  [address] [place] [address]      Provider Number: [id number]  Attending Physician Name and Address:  Kathlen Mody, MD  Relative Name and Phone Number:  [name]    Current Level of Care: Hospital Recommended Level of Care: Nursing Facility Prior Approval Number:    Date Approved/Denied:   PASRR Number:  [id number])  Discharge Plan: SNF    Current Diagnoses: Patient Active Problem List   Diagnosis Date Noted   Osteomyelitis (HCC) 07/19/2022   Aspiration pneumonia of both lower lobes (HCC) 07/19/2022   Sepsis (HCC) 07/19/2022   Sepsis due to undetermined organism (HCC) 07/16/2022   Hypernatremia 07/16/2022   AKI (acute kidney injury) (HCC) 07/16/2022   Leukocytosis 07/16/2022   Stage IV pressure ulcer of sacral region (HCC) 07/16/2022   Alzheimer's dementia (HCC) 07/16/2022   Restlessness 08/24/2018   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 Noted to be height on 10/02/19)  BEHAVIORAL SYMPTOMS/MOOD NEUROLOGICAL BOWEL NUTRITION STATUS      Incontinent Diet (dysphagia 1 nectar thick)  AMBULATORY STATUS COMMUNICATION OF NEEDS Skin   Total Care Verbally         PU Stage 4 Dressing: BID (See wound care orders)               Personal Care Assistance Level of Assistance  Bathing, Feeding, Dressing Bathing Assistance: Maximum assistance Feeding assistance: Limited assistance Dressing Assistance:  Maximum assistance     Functional Limitations Info  Sight, Hearing, Speech Sight Info: Adequate Hearing Info: Adequate Speech Info: Impaired (dysphagia 1)    SPECIAL CARE FACTORS FREQUENCY                       Contractures Contractures Info: Not present    Additional Factors Info  Code Status, Allergies Code Status Info:  (DNR) Allergies Info:  (NKA)           Current Medications (07/19/2022):  This is the current hospital active medication list Current Facility-Administered Medications  Medication Dose Route Frequency Provider Last Rate Last Admin   0.45 % sodium chloride infusion   Intravenous Continuous 13/02/2022, MD 65 mL/hr at 07/18/22 1254 New Bag at 07/18/22 1254   acetaminophen (TYLENOL) tablet 650 mg  650 mg Oral Q6H PRN 13/05/23, MD   650 mg at 07/19/22 13/06/23   Or   acetaminophen (TYLENOL) suppository 650 mg  650 mg Rectal Q6H PRN 4010, MD       ceFEPIme (MAXIPIME) 2 g in sodium chloride 0.9 % 100 mL IVPB  2 g Intravenous Q12H Shade, Bobette Mo, RPH 200 mL/hr at 07/19/22 0952 2 g at 07/19/22 13/06/23   Chlorhexidine Gluconate Cloth 2 % PADS 6 each  6 each Topical Daily 2725, MD   6 each at 07/19/22 0953   dextrose 5 % solution   Intravenous Continuous 13/06/23, MD 125 mL/hr at 07/19/22 0704 New Bag at 07/19/22 [phone]  haloperidol lactate (HALDOL) injection 3 mg  3 mg Intravenous Q6H PRN Reubin Milan, MD       heparin injection 5,000 Units  5,000 Units Subcutaneous Q8H Reubin Milan, MD   5,000 Units at 07/19/22 0541   ondansetron [facility]) tablet 4 mg  4 mg Oral Q6H PRN Reubin Milan, MD       Or   ondansetron [facility]) injection 4 mg  4 mg Intravenous Q6H PRN Reubin Milan, MD       senna-docusate (Senokot-S) tablet 2 tablet  2 tablet Oral BID Hosie Poisson, MD   2 tablet at 07/18/22 2120   sodium hypochlorite (DAKIN'S 1/4 STRENGTH) topical solution   Irrigation BID Hosie Poisson, MD    Given at 07/18/22 2020   vancomycin (VANCOCIN) IVPB 1000 mg/200 mL premix  1,000 mg Intravenous Q24H Emiliano Dyer, [facility]         Discharge Medications: Please see discharge summary for a list of discharge medications.  Relevant Imaging Results:  Relevant Lab Results:   Additional Information  [phone])  Amberlynn Tempesta, Juliann Pulse, RN

## 2022-07-19 NOTE — Consult Note (Signed)
[facility] for Infectious Disease    Date of Admission:  07/16/2022     Reason for Consult: Sacral OM     Referring Physician: Dr Karleen Hampshire  Current antibiotics: Cefepime Vancomycin   ASSESSMENT:    66 y.o. 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  He also was found to have left lower lobe pneumonia probably related to aspiration.  Currently improved on broad-spectrum antibiotics and awaiting surgical evaluation for possible debridement.  #Acute kidney injury Evaluated by nephrology and creatinine has improved.  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 male  with past medical history as noted below who was admitted 07/16/2022 after being brought from his living facility due to fever for 2 days associated with decreased level of consciousness from his baseline.  He underwent imaging of the CT chest, abdomen, pelvis.  This showed sacrococcygeal decubitus ulcer notable for coccygeal osteomyelitis and local infection.  There was no drainable abscess noted on imaging.  He also was noted to have a left lower lobe infiltrate with small left pleural effusion.  He was started on broad-spectrum antibiotics.  He was febrile to 104.2 F.  He was started on supplemental nasal cannula as well.  Lab work has revealed acute kidney injury with creatinine 4.3 on admission which has now improved down to 0.7.  His leukocytosis has improved.  His blood cultures drawn 3 days ago are no growth to date.  The primary team has reached out to surgery regarding possible formal debridement of his sacral wound.  Wound care has also seen him and has recommended the same.  Nephrology was consulted in the setting of AKI and felt that this was likely related to poor p.o. intake recently in the setting of sepsis and hypotension.   Past Medical History:  Diagnosis Date   Alzheimer's dementia ([place])    Anxiety    Hypercholesterolemia    Hypertension     Social History   Tobacco Use   Smoking status: Never   Smokeless tobacco: Never  Vaping Use   Vaping Use: Never used  Substance Use Topics   Alcohol use: No   Drug use: No    Family History  Problem Relation Age of Onset   Alzheimer's disease Mother    Alzheimer's disease Brother    Alzheimer's disease Maternal Aunt     No Known Allergies  Review of Systems  Unable to perform ROS: Dementia    OBJECTIVE:   Blood pressure (!) 110/91, pulse 96, temperature 100.1 F (37.8 C), temperature source Oral, resp. rate 20, height 5\' 6"  (1.676 m), weight 62.1 kg, SpO2 99 %. Body mass index is 22.1 kg/m.  Physical  Exam Constitutional:      General: He is not in acute distress.    Appearance: Normal appearance.  HENT:     Head: Normocephalic and atraumatic.  Eyes:     Extraocular Movements: Extraocular movements intact.     Conjunctiva/sclera: Conjunctivae normal.  Cardiovascular:     Rate and Rhythm: Normal rate and regular rhythm.  Pulmonary:     Effort: Pulmonary effort is normal. No respiratory distress.     Comments: He is lying on his left side in the lateral recumbent position.  He is breathing comfortably on 2 L nasal cannula. Abdominal:     General: There is no distension.     Palpations: Abdomen is soft.  Musculoskeletal:     Cervical back: Normal range of motion and neck supple.     Comments: Sacral wound images reviewed in the media tab  Skin:    General: Skin is warm and dry.     Findings: No rash.  Neurological:     Mental Status: He is alert.     Comments: Patient is nonverbal.  He arouses to voice and touch.  He does not otherwise follow commands or interact.      Lab Results: Lab Results  Component Value Date   WBC 11.2 (H) 07/19/2022   HGB 9.2 (L) 07/19/2022   HCT 28.6 (L) 07/19/2022   MCV 89.4 07/19/2022   PLT 158 07/19/2022    Lab Results  Component Value Date   NA 140 07/19/2022   K 3.1 (L) 07/19/2022   CO2 20 (L) 07/19/2022   GLUCOSE 109 (H) 07/19/2022   BUN 19 07/19/2022   CREATININE 0.73 07/19/2022   CALCIUM 7.4 (L) 07/19/2022   GFRNONAA >60 07/19/2022   GFRAA >60 02/04/2020    Lab Results  Component Value Date   ALT 66 (H) 07/19/2022   AST 109 (H) 07/19/2022   ALKPHOS 48 07/19/2022   BILITOT 0.7 07/19/2022    No results found for: "CRP"  No results found for: "ESRSEDRATE"  I have reviewed the micro and lab results in Epic.  Imaging: No results found.   Imaging independently reviewed in Epic.  [name] for Infectious Disease [facility] [phone] pager 07/19/2022, 10:49 AM

## 2022-07-19 NOTE — Progress Notes (Signed)
SLP Cancellation Note  Patient Details Name: [name] MRN: [id number] DOB: 1956-05-26   Cancelled treatment:       Reason Eval/Treat Not Completed: Patient's level of consciousness. Patient asleep and did not arouse to voice; SLP opted not to try further to wake him. Per RN, he did well with his breakfast tray this morning.   Sonia Baller, MA, CCC-SLP Speech Therapy

## 2022-07-19 NOTE — Consult Note (Signed)
Consult Note  [name] 1956/01/13  [id number].    Requesting MD: Dr. Karleen Hampshire Chief Complaint/Reason for Consult: Sacral wound  HPI:  66 y.o. male with medical history significant for advanced Alzheimer's dementia, HTN, hypercholesterolemia who presented to [facility] ED with fever and change in mental status. Work up in the ED was significant for CT scan showing sacrococcygeal decubitus ulcer with osteomyelitis, sepsis, pneumonia, and AKI. Patient was admitted to the hospitalist service for further care and general surgery asked to see in regard to sacral wound. Nephrology has been consulted in regard to AKI and ID following for osteomyelitis  At time of my exam patient does not have family in the room and history is taken from RN who is bedside and chart review.  No past surgical history per chart review.  ROS: Review of Systems  Unable to perform ROS: Dementia    Family History  Problem Relation Age of Onset   Alzheimer's disease Mother    Alzheimer's disease Brother    Alzheimer's disease Maternal Aunt     Past Medical History:  Diagnosis Date   Alzheimer's dementia ([place])    Anxiety    Hypercholesterolemia    Hypertension     History reviewed. No pertinent surgical history.  Social History:  reports that he has never smoked. He has never used smokeless tobacco. He reports that he does not drink alcohol and does not use drugs.  Allergies: No Known Allergies  Medications Prior to Admission  Medication Sig Dispense Refill   Amino Acids-Protein Hydrolys (PRO-STAT PO) Take 30 mLs by mouth 2 (two) times daily.     ascorbic acid (VITAMIN C) 500 MG tablet Take 500 mg by mouth daily.     [EXPIRED] cefTRIAXone (ROCEPHIN) 1 g injection Inject 1 g into the muscle See admin instructions. Once daily for 3 days.     Cholecalciferol 25 MCG (1000 UT) capsule Take 1,000 Units by mouth daily.     divalproex (DEPAKOTE) 125 MG DR tablet Take 125 mg by mouth 2 (two) times daily.      memantine (NAMENDA) 10 MG tablet Take 10 mg by mouth 2 (two) times daily.     metoprolol succinate (TOPROL-XL) 25 MG 24 hr tablet Take 25 mg by mouth daily.     Multiple Vitamins-Minerals (MULTIVITAL PO) Take 1 tablet by mouth daily.     vitamin B-12 (CYANOCOBALAMIN) 1000 MCG tablet Take 1,000 mcg by mouth daily.     ZINC OXIDE EX Apply 1 Application topically 2 (two) times daily.     zinc sulfate 220 (50 Zn) MG capsule Take 220 mg by mouth daily.      Blood pressure (!) 110/91, pulse 96, temperature 100.1 F (37.8 C), temperature source Oral, resp. rate 20, height 5\' 6"  (1.676 m), weight 62.1 kg, SpO2 99 %. Physical Exam: General: pleasant, WD, male who is laying in bed in NAD HEENT: head is normocephalic, atraumatic.  Sclera are noninjected.  Pupils equal and round. EOMs intact.  Ears and nose without any masses or lesions.  Mouth is pink and moist Heart: regular, rate, and rhythm.  Lungs: Respiratory effort nonlabored Abd: soft, NT, ND Skin: warm and dry. Sacral wound with surrounding erythema and maceration wound undermines circumferentially but no palpable abscess or active purulent drainage. Coccyx palpable base of wound. See below Psych: alert. Nonverbal. Responds to voice     Results for orders placed or performed during the hospital encounter of 07/16/22 (from the past 48  hour(s))  Glucose, capillary     Status: Abnormal   Collection Time: 07/17/22 11:57 AM  Result Value Ref Range   Glucose-Capillary 103 (H) 70 - 99 mg/dL    Comment: Glucose reference range applies only to samples taken after fasting for at least 8 hours.   Comment 1 Notify RN   Basic metabolic panel     Status: Abnormal   Collection Time: 07/17/22  4:02 PM  Result Value Ref Range   Sodium 156 (H) 135 - 145 mmol/L   Potassium 3.4 (L) 3.5 - 5.1 mmol/L   Chloride 126 (H) 98 - 111 mmol/L   CO2 20 (L) 22 - 32 mmol/L   Glucose, Bld 106 (H) 70 - 99 mg/dL    Comment: Glucose reference range applies only to  samples taken after fasting for at least 8 hours.   BUN 69 (H) 8 - 23 mg/dL   Creatinine, Ser 3.32 (H) 0.61 - 1.24 mg/dL   Calcium 7.4 (L) 8.9 - 10.3 mg/dL   GFR, Estimated 20 (L) >60 mL/min    Comment: (NOTE) Calculated using the CKD-EPI Creatinine Equation (2021)    Anion gap 10 5 - 15    Comment: Performed at [facility], [place] [address] [place], [place] [address]  Strep pneumoniae urinary antigen     Status: None   Collection Time: 07/17/22  5:48 PM  Result Value Ref Range   Strep Pneumo Urinary Antigen NEGATIVE NEGATIVE    Comment: Performed at [place] [address], [place], [place] [address]  Glucose, capillary     Status: Abnormal   Collection Time: 07/17/22  6:00 PM  Result Value Ref Range   Glucose-Capillary 108 (H) 70 - 99 mg/dL    Comment: Glucose reference range applies only to samples taken after fasting for at least 8 hours.   Comment 1 Notify RN   Glucose, capillary     Status: Abnormal   Collection Time: 07/18/22 12:25 AM  Result Value Ref Range   Glucose-Capillary 131 (H) 70 - 99 mg/dL    Comment: Glucose reference range applies only to samples taken after fasting for at least 8 hours.  Comprehensive metabolic panel     Status: Abnormal   Collection Time: 07/18/22  4:28 AM  Result Value Ref Range   Sodium 146 (H) 135 - 145 mmol/L    Comment: DELTA CHECK NOTED   Potassium 3.1 (L) 3.5 - 5.1 mmol/L   Chloride 123 (H) 98 - 111 mmol/L   CO2 20 (L) 22 - 32 mmol/L   Glucose, Bld 140 (H) 70 - 99 mg/dL    Comment: Glucose reference range applies only to samples taken after fasting for at least 8 hours.   BUN 47 (H) 8 - 23 mg/dL   Creatinine, Ser 1.94 (H) 0.61 - 1.24 mg/dL   Calcium 7.2 (L) 8.9 - 10.3 mg/dL   Total Protein 4.6 (L) 6.5 - 8.1 g/dL   Albumin 1.8 (L) 3.5 - 5.0 g/dL   AST 177 (H) 15 - 41 U/L   ALT 71 (H) 0 - 44 U/L   Alkaline Phosphatase 42 38 - 126 U/L   Total Bilirubin 0.4 0.3 - 1.2 mg/dL   GFR, Estimated 38 (L) >60  mL/min    Comment: (NOTE) Calculated using the CKD-EPI Creatinine Equation (2021)    Anion gap 3 (L) 5 - 15    Comment: Performed at [facility], [address] [place], [place]  [address]  CBC with Differential/Platelet     Status: Abnormal   Collection Time: 07/18/22  4:28 AM  Result Value Ref Range   WBC 12.9 (H) 4.0 - 10.5 K/uL   RBC 3.28 (L) 4.22 - 5.81 MIL/uL   Hemoglobin 9.3 (L) 13.0 - 17.0 g/dL   HCT 30.6 (L) 39.0 - 52.0 %   MCV 93.3 80.0 - 100.0 fL   MCH 28.4 26.0 - 34.0 pg   MCHC 30.4 30.0 - 36.0 g/dL   RDW 14.4 11.5 - 15.5 %   Platelets 152 150 - 400 K/uL   nRBC 0.3 (H) 0.0 - 0.2 %   Neutrophils Relative % 86 %   Neutro Abs 11.1 (H) 1.7 - 7.7 K/uL   Lymphocytes Relative 7 %   Lymphs Abs 0.9 0.7 - 4.0 K/uL   Monocytes Relative 3 %   Monocytes Absolute 0.4 0.1 - 1.0 K/uL   Eosinophils Relative 1 %   Eosinophils Absolute 0.2 0.0 - 0.5 K/uL   Basophils Relative 0 %   Basophils Absolute 0.0 0.0 - 0.1 K/uL   Immature Granulocytes 3 %   Abs Immature Granulocytes 0.32 (H) 0.00 - 0.07 K/uL    Comment: Performed at [facility], [place] [address] [place], [place] [address]  TSH     Status: None   Collection Time: 07/18/22  4:28 AM  Result Value Ref Range   TSH 2.049 0.350 - 4.500 uIU/mL    Comment: Performed by a 3rd Generation assay with a functional sensitivity of <=0.01 uIU/mL. Performed at [facility], [place] [address], [place], [place] [address]   Magnesium     Status: None   Collection Time: 07/18/22  4:28 AM  Result Value Ref Range   Magnesium 2.4 1.7 - 2.4 mg/dL    Comment: Performed at [facility], [place] [address], [place], [place] [address]  Phosphorus     Status: Abnormal   Collection Time: 07/18/22  4:28 AM  Result Value Ref Range   Phosphorus 2.3 (L) 2.5 - 4.6 mg/dL    Comment: Performed at [facility], [place] [address] [place], [place] [address]  Vitamin B12      Status: Abnormal   Collection Time: 07/18/22  4:28 AM  Result Value Ref Range   Vitamin B-12 2,370 (H) 180 - 914 pg/mL    Comment: RESULT CONFIRMED BY MANUAL DILUTION (NOTE) This assay is not validated for testing neonatal or myeloproliferative syndrome specimens for Vitamin B12 levels. Performed at [facility], [place] [address] [place], [place] [address]   Folate     Status: None   Collection Time: 07/18/22  4:28 AM  Result Value Ref Range   Folate 9.7 >5.9 ng/mL    Comment: Performed at [facility], [place] [address], [place], [place] [address]  Iron and TIBC     Status: Abnormal   Collection Time: 07/18/22  4:28 AM  Result Value Ref Range   Iron 27 (L) 45 - 182 ug/dL   TIBC 114 (L) 250 - 450 ug/dL   Saturation Ratios 24 17.9 - 39.5 %   UIBC 87 ug/dL    Comment: Performed at [facility], [place] [address], [place], [place] [address]  Ferritin     Status: Abnormal   Collection Time: 07/18/22  4:28 AM  Result Value Ref Range   Ferritin 961 (H) 24 - 336 ng/mL    Comment: Performed at [facility], [place] [address], [place], [place] [address]  Reticulocytes     Status: Abnormal   Collection Time: 07/18/22  4:28 AM  Result Value Ref Range   Retic Ct Pct 0.5 0.4 - 3.1 %   RBC. 3.23 (L) 4.22 - 5.81 MIL/uL   Retic Count, Absolute 15.5 (L) 19.0 - 186.0 K/uL   Immature Retic Fract 6.7 2.3 - 15.9 %    Comment: Performed at [facility], [place] [address] [place], [place] [address]  Glucose, capillary     Status: Abnormal   Collection Time: 07/18/22  7:13 AM  Result Value Ref Range   Glucose-Capillary 118 (H) 70 - 99 mg/dL    Comment: Glucose reference range applies only to samples taken after fasting for at least 8 hours.  Vancomycin, random     Status: None   Collection Time: 07/18/22 10:12 AM  Result Value Ref Range   Vancomycin Rm 8 ug/mL    Comment:        Random Vancomycin  therapeutic range is dependent on dosage and time of specimen collection. A peak range is 20-40 ug/mL A trough range is 5-15 ug/mL        Performed at [place] [address], [place], [place] [address]   Glucose, capillary     Status: Abnormal   Collection Time: 07/18/22 11:36 AM  Result Value Ref Range   Glucose-Capillary 113 (H) 70 - 99 mg/dL    Comment: Glucose reference range applies only to samples taken after fasting for at least 8 hours.   Comment 1 Notify RN   Glucose, capillary     Status: None   Collection Time: 07/18/22  5:34 PM  Result Value Ref Range   Glucose-Capillary 90 70 - 99 mg/dL    Comment: Glucose reference range applies only to samples taken after fasting for at least 8 hours.  Basic metabolic panel     Status: Abnormal   Collection Time: 07/18/22  8:06 PM  Result Value Ref Range   Sodium 145 135 - 145 mmol/L   Potassium 3.5 3.5 - 5.1 mmol/L   Chloride 122 (H) 98 - 111 mmol/L   CO2 18 (L) 22 - 32 mmol/L   Glucose, Bld 114 (H) 70 - 99 mg/dL    Comment: Glucose reference range applies only to samples taken after fasting for at least 8 hours.   BUN 29 (H) 8 - 23 mg/dL   Creatinine, Ser 1.21 0.61 - 1.24 mg/dL   Calcium 7.4 (L) 8.9 - 10.3 mg/dL   GFR, Estimated >60 >60 mL/min    Comment: (NOTE) Calculated using the CKD-EPI Creatinine Equation (2021)    Anion gap 5 5 - 15    Comment: Performed at [facility], [place] [address], [place], [place] [address]  Glucose, capillary     Status: None   Collection Time: 07/18/22 11:50 PM  Result Value Ref Range   Glucose-Capillary 71 70 - 99 mg/dL    Comment: Glucose reference range applies only to samples taken after fasting for at least 8 hours.  Glucose, capillary     Status: None   Collection Time: 07/19/22  6:03 AM  Result Value Ref Range   Glucose-Capillary 82 70 - 99 mg/dL    Comment: Glucose reference range applies only to samples taken after fasting for at least 8  hours.  Comprehensive metabolic panel     Status: Abnormal   Collection Time: 07/19/22  7:27  AM  Result Value Ref Range   Sodium 140 135 - 145 mmol/L   Potassium 3.1 (L) 3.5 - 5.1 mmol/L   Chloride 115 (H) 98 - 111 mmol/L   CO2 20 (L) 22 - 32 mmol/L   Glucose, Bld 109 (H) 70 - 99 mg/dL    Comment: Glucose reference range applies only to samples taken after fasting for at least 8 hours.   BUN 19 8 - 23 mg/dL   Creatinine, Ser 0.73 0.61 - 1.24 mg/dL   Calcium 7.4 (L) 8.9 - 10.3 mg/dL   Total Protein 4.7 (L) 6.5 - 8.1 g/dL   Albumin 1.8 (L) 3.5 - 5.0 g/dL   AST 109 (H) 15 - 41 U/L   ALT 66 (H) 0 - 44 U/L   Alkaline Phosphatase 48 38 - 126 U/L   Total Bilirubin 0.7 0.3 - 1.2 mg/dL   GFR, Estimated >60 >60 mL/min    Comment: (NOTE) Calculated using the CKD-EPI Creatinine Equation (2021)    Anion gap 5 5 - 15    Comment: Performed at [facility], [place] [address], [place], [place] [address]  CBC with Differential/Platelet     Status: Abnormal   Collection Time: 07/19/22  7:27 AM  Result Value Ref Range   WBC 11.2 (H) 4.0 - 10.5 K/uL   RBC 3.20 (L) 4.22 - 5.81 MIL/uL   Hemoglobin 9.2 (L) 13.0 - 17.0 g/dL   HCT 28.6 (L) 39.0 - 52.0 %   MCV 89.4 80.0 - 100.0 fL   MCH 28.8 26.0 - 34.0 pg   MCHC 32.2 30.0 - 36.0 g/dL   RDW 14.0 11.5 - 15.5 %   Platelets 158 150 - 400 K/uL   nRBC 0.2 0.0 - 0.2 %   Neutrophils Relative % 87 %   Neutro Abs 9.6 (H) 1.7 - 7.7 K/uL   Lymphocytes Relative 6 %   Lymphs Abs 0.7 0.7 - 4.0 K/uL   Monocytes Relative 3 %   Monocytes Absolute 0.4 0.1 - 1.0 K/uL   Eosinophils Relative 2 %   Eosinophils Absolute 0.2 0.0 - 0.5 K/uL   Basophils Relative 0 %   Basophils Absolute 0.1 0.0 - 0.1 K/uL   Immature Granulocytes 2 %   Abs Immature Granulocytes 0.24 (H) 0.00 - 0.07 K/uL    Comment: Performed at [facility], [place] [address], [place], [place] [address]   No results found.    Assessment/Plan Sacral wound WOC RN  has consulted and agree with recommendations for dakins wet to dry x3 days (start date 11/05) with transition to NS wet to dry after. WBC improving on antibiotics with underlying osteomyelitis but no drainable abscess on CT. Wound does not require surgical debridement at this time and recommend continuing dressing changes as above with antibiotics per ID We will sign off but please do not hesitate to contact us with any questions or concerns or reconsult if needed.   I reviewed Consultant Nephrology, ID notes, hospitalist notes, last 24 h vitals and pain scores, last 48 h intake and output, last 24 h labs and trends, and last 24 h imaging results.    Winferd Humphrey, [facility] Surgery 07/19/2022, 9:15 AM Please see Amion for pager number during day hours 7:00am-4:30pm

## 2022-07-20 DIAGNOSIS — A419 Sepsis, unspecified organism: Secondary | ICD-10-CM | POA: Diagnosis not present

## 2022-07-20 DIAGNOSIS — Z515 Encounter for palliative care: Secondary | ICD-10-CM

## 2022-07-20 DIAGNOSIS — N179 Acute kidney failure, unspecified: Secondary | ICD-10-CM | POA: Diagnosis not present

## 2022-07-20 DIAGNOSIS — G300 Alzheimer's disease with early onset: Secondary | ICD-10-CM | POA: Diagnosis not present

## 2022-07-20 DIAGNOSIS — L89154 Pressure ulcer of sacral region, stage 4: Secondary | ICD-10-CM | POA: Diagnosis not present

## 2022-07-20 DIAGNOSIS — J690 Pneumonitis due to inhalation of food and vomit: Secondary | ICD-10-CM | POA: Diagnosis not present

## 2022-07-20 DIAGNOSIS — M8618 Other acute osteomyelitis, other site: Secondary | ICD-10-CM | POA: Diagnosis not present

## 2022-07-20 LAB — PROCALCITONIN: Procalcitonin: 0.52 ng/mL

## 2022-07-20 LAB — CBC WITH DIFFERENTIAL/PLATELET
Abs Immature Granulocytes: 0.34 10*3/uL — ABNORMAL HIGH (ref 0.00–0.07)
Basophils Absolute: 0.1 10*3/uL (ref 0.0–0.1)
Basophils Relative: 0 %
Eosinophils Absolute: 0.4 10*3/uL (ref 0.0–0.5)
Eosinophils Relative: 2 %
HCT: 32.1 % — ABNORMAL LOW (ref 39.0–52.0)
Hemoglobin: 10.5 g/dL — ABNORMAL LOW (ref 13.0–17.0)
Immature Granulocytes: 2 %
Lymphocytes Relative: 5 %
Lymphs Abs: 0.7 10*3/uL (ref 0.7–4.0)
MCH: 28.9 pg (ref 26.0–34.0)
MCHC: 32.7 g/dL (ref 30.0–36.0)
MCV: 88.4 fL (ref 80.0–100.0)
Monocytes Absolute: 0.7 10*3/uL (ref 0.1–1.0)
Monocytes Relative: 4 %
Neutro Abs: 13.7 10*3/uL — ABNORMAL HIGH (ref 1.7–7.7)
Neutrophils Relative %: 87 %
Platelets: 195 10*3/uL (ref 150–400)
RBC: 3.63 MIL/uL — ABNORMAL LOW (ref 4.22–5.81)
RDW: 14 % (ref 11.5–15.5)
WBC: 15.8 10*3/uL — ABNORMAL HIGH (ref 4.0–10.5)
nRBC: 0 % (ref 0.0–0.2)

## 2022-07-20 LAB — COMPREHENSIVE METABOLIC PANEL
ALT: 70 U/L — ABNORMAL HIGH (ref 0–44)
AST: 83 U/L — ABNORMAL HIGH (ref 15–41)
Albumin: 2.1 g/dL — ABNORMAL LOW (ref 3.5–5.0)
Alkaline Phosphatase: 62 U/L (ref 38–126)
Anion gap: 9 (ref 5–15)
BUN: 15 mg/dL (ref 8–23)
CO2: 22 mmol/L (ref 22–32)
Calcium: 7.9 mg/dL — ABNORMAL LOW (ref 8.9–10.3)
Chloride: 109 mmol/L (ref 98–111)
Creatinine, Ser: 0.75 mg/dL (ref 0.61–1.24)
GFR, Estimated: >60 mL/min (ref >60)
Glucose, Bld: 111 mg/dL — ABNORMAL HIGH (ref 70–99)
Potassium: 3.4 mmol/L — ABNORMAL LOW (ref 3.5–5.1)
Sodium: 140 mmol/L (ref 135–145)
Total Bilirubin: 0.6 mg/dL (ref 0.3–1.2)
Total Protein: 5.5 g/dL — ABNORMAL LOW (ref 6.5–8.1)

## 2022-07-20 LAB — LEGIONELLA PNEUMOPHILA SEROGP 1 UR AG: L. pneumophila Serogp 1 Ur Ag: NEGATIVE

## 2022-07-20 LAB — GLUCOSE, CAPILLARY
Glucose-Capillary: 106 mg/dL — ABNORMAL HIGH (ref 70–99)
Glucose-Capillary: 119 mg/dL — ABNORMAL HIGH (ref 70–99)
Glucose-Capillary: 129 mg/dL — ABNORMAL HIGH (ref 70–99)
Glucose-Capillary: 86 mg/dL (ref 70–99)

## 2022-07-20 LAB — MAGNESIUM: Magnesium: 2 mg/dL (ref 1.7–2.4)

## 2022-07-20 LAB — PHOSPHORUS: Phosphorus: 1.7 mg/dL — ABNORMAL LOW (ref 2.5–4.6)

## 2022-07-20 MED ORDER — ORAL CARE MOUTH RINSE: 15.0000 mL | OROMUCOSAL | Status: DC | PRN

## 2022-07-20 MED ORDER — SODIUM CHLORIDE 0.9 % IV SOLN
3.0000 g | Freq: Four times a day (QID) | INTRAVENOUS | Status: DC
  Administered 2022-07-20 – 2022-07-21 (×4): 3 g via INTRAVENOUS
  Filled 2022-07-20 (×6): qty 8

## 2022-07-20 MED ORDER — MORPHINE SULFATE (CONCENTRATE) 10 MG/0.5ML PO SOLN: 20.0000 mg | ORAL | 0 refills | Status: AC | PRN

## 2022-07-20 MED ORDER — ORAL CARE MOUTH RINSE
15.0000 mL | OROMUCOSAL | Status: DC
  Administered 2022-07-20 – 2022-07-21 (×7): 15 mL via OROMUCOSAL

## 2022-07-20 MED ORDER — MORPHINE SULFATE (CONCENTRATE) 10 MG/0.5ML PO SOLN
20.0000 mg | ORAL | Status: DC | PRN
  Administered 2022-07-20: 20 mg via SUBLINGUAL
  Filled 2022-07-20: qty 1

## 2022-07-20 NOTE — TOC Progression Note (Signed)
Transition of Care [facility]) - Progression Note    Patient Details  Name: [name] MRN: [id number] Date of Birth: [birth year]  Transition of Care [facility]) CM/SW Contact  Glenora Morocho, Juliann Pulse, RN Phone Number: 07/20/2022, 1:41 PM  Clinical Narrative: d/c back to [facility] with [facility]-rep [name].      Expected Discharge Plan: Long Term Nursing Home Barriers to Discharge: Continued Medical Work up  Expected Discharge Plan and Services Expected Discharge Plan: [place]   Discharge Planning Services: CM Consult                                           Social Determinants of Health (SDOH) Interventions    Readmission Risk Interventions     No data to display

## 2022-07-20 NOTE — Progress Notes (Signed)
[facility]                                                                               [name], is a 66 y.o. male, DOB - [birth year], NKN:[id number] Admit date - 07/16/2022    Outpatient Primary MD for the patient is [name], MD  LOS - 4  days    Brief summary     Assessment & Plan   [name] is a 66 y.o. male with medical history significant of advanced early Alzheimer's dementia, anxiety, hypertension, hyperlipidemia who is brought from his facility due to fever for the past 2 days associated with decreased level of consciousness.  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  No drainable abscess observed.  Left lower lobe infiltrate and small left pleural effusion.  There is mucus in the trachea and both mainstem bronchi nearly completely filling the bronchus intermedius and plugging the right lower lobe bronchi.   He was admitted for sepsis secondary to infected sacro coccygeal decubitus ulcer and coccygeal OM and LLL pneumonia, along with AKI.  He was started on broad spectrum IV antibiotics, general surgery, ID and nephrology consulted for recommendations.    Assessment and Plan:  Sepsis secondary to infected sacro coccygeal decubitus ulcer with coccygeal OM and left lower lobe pneumonia.  Sepsis physiology has improved.  Currently requiring about 2 to 3 lit of [place] oxygen.  Started him on IV vancomycin and IV cefepime transitioned to unasyn and vancomycin.   Follow up blood cultures. Follow pro calcitonin.  COVID PCR and influenza PCR is negative.  Urine for strep pneumonia  is negative  Sputum cultures ordered, if able to obtain.  Surgery consulted  and recommended no surgical debridement at this time.  Requested ID consult  for the duration of the antibiotics.  Improving leukocytosis but continues to be febrile.   Acute metabolic encephalopathy in  the setting of severe Alzheimer dementia.  - from AKI, Uremia and hypernatremia and sepsis from OM of the coccygeal area and LLLpneumonia.  - TSH , vit b12 and folic acid level are unremarkable.  Slp evaluation ordered and recommended dysphagia 1 diet.  - patient's mental status appears to be at baseline as per the wife.    AKI with AG metabolic acidosis:  Suspect from poor oral intake and sepsis.  Nephrology on board and unfortunately patient is not a candidate for HD.  Creatinine improved to 3.32 to 1.94 to back to baseline with IV fluids. BMP is pending.  Foley catheter placed for measuring urine output.    Hypernatremia: from free fluid deficit.  Admitted with a sodium of 167 , improved to 156 tp 146 to 140.  Dextrose fluids discontinued.     Hypokalemia:  Replaced. Recheck in am along with  magnesium and phos.    Alzheimer's Dementia:  Started 9 years ago and has been bed bound for the last 2 years.   Normocytic Anemia: baseline hemoglobin 3 years ago was around 12.  Hemoglobin slowly dropping, 14( hemo concentrated sample from dehydration ) to   10.3to 9.3 . Continue to monitor.  Anemia panel  reviewed.  Suspect drop of hemoglobin from hemodilution.   Looks like his new baseline hemoglobin appears to be around 9.    Mild thrombocytopenia:  From sepsis? Much improved.    Elevated transaminases:  - suspect form hypotension, sepsis. Liver enzymes are improving.  - recheck liver panel in one week.     In view of advanced dementia, poor functional status, palliative care consulted for goals of care. Wife is requesting for possible hospice to follow up at the SNF.  Palliative team on board and appreciate recommendations.      Pressure injury:  RN Pressure Injury Documentation: Pressure Injury 07/16/22 Sacrum Medial Unstageable - Full thickness tissue loss in which the base of the injury is covered by slough (yellow, tan, gray, green or brown) and/or eschar (tan,  brown or black) in the wound bed. Sloughs, eschar, drainage and o (Active)  07/16/22 2209  Location: Sacrum  Location Orientation: Medial  Staging: Unstageable - Full thickness tissue loss in which the base of the injury is covered by slough (yellow, tan, gray, green or brown) and/or eschar (tan, brown or black) in the wound bed.  Wound Description (Comments): Sloughs, eschar, drainage and odor  Present on Admission: Yes  Dressing Type Dakin's-soaked gauze;Gauze (Comment);Foam - Lift dressing to assess site every shift 07/19/22 2245     Pressure Injury 07/17/22 Heel Left Deep Tissue Pressure Injury - Purple or maroon localized area of discolored intact skin or blood-filled blister due to damage of underlying soft tissue from pressure and/or shear. (Active)  07/17/22 0800  Location: Heel  Location Orientation: Left  Staging: Deep Tissue Pressure Injury - Purple or maroon localized area of discolored intact skin or blood-filled blister due to damage of underlying soft tissue from pressure and/or shear.  Wound Description (Comments):   Present on Admission:    Wound care consulted, rcommendations for [place] wet to dry for 3 days, and transition to NS wet to dry after.   Estimated body mass index is 22.1 kg/m as calculated from the following:   Height as of this encounter: 5\' 6"  (1.676 m).   Weight as of this encounter: 62.1 kg.  Code Status: DNR DVT Prophylaxis:  heparin injection 5,000 Units Start: 07/16/22 2200   Level of Care: Level of care: Progressive Family Communication: Updated patient's wife at bedside.   Disposition Plan:     Remains inpatient appropriate:  IV antibiotics.   Procedures:  NONE  Consultants:   Palliative care Nephrology.   Antimicrobials:   Anti-infectives (From admission, onward)    Start     Dose/Rate Route Frequency Ordered Stop   07/20/22 2000  Ampicillin-Sulbactam (UNASYN) 3 g in sodium chloride 0.9 % 100 mL IVPB        3 g 200 mL/hr over 30  Minutes Intravenous Every 6 hours 07/20/22 0911     07/19/22 1800  ceFEPIme (MAXIPIME) 2 g in sodium chloride 0.9 % 100 mL IVPB  Status:  Discontinued        2 g 200 mL/hr over 30 Minutes Intravenous Every 8 hours 07/19/22 1155 07/20/22 0905   07/19/22 1230  vancomycin (VANCOREADY) IVPB 1500 mg/300 mL        1,500 mg 150 mL/hr over 120 Minutes Intravenous Every 24 hours 07/19/22 1154     07/19/22 1200  vancomycin (VANCOCIN) IVPB 1000 mg/200 mL premix  Status:  Discontinued        1,000 mg 200 mL/hr over 60 Minutes Intravenous Every 24 hours 07/19/22 0754 07/19/22  1154   07/18/22 1200  vancomycin (VANCOREADY) IVPB 750 mg/150 mL  Status:  Discontinued        750 mg 150 mL/hr over 60 Minutes Intravenous Every 24 hours 07/18/22 1119 07/19/22 0754   07/18/22 1100  ceFEPIme (MAXIPIME) 2 g in sodium chloride 0.9 % 100 mL IVPB  Status:  Discontinued        2 g 200 mL/hr over 30 Minutes Intravenous Every 12 hours 07/18/22 1002 07/19/22 1155   07/17/22 1803  vancomycin variable dose per unstable renal function (pharmacist dosing)  Status:  Discontinued         Does not apply See admin instructions 07/17/22 1803 07/19/22 0710   07/16/22 2000  ceFEPIme (MAXIPIME) 2 g in sodium chloride 0.9 % 100 mL IVPB  Status:  Discontinued        2 g 200 mL/hr over 30 Minutes Intravenous Every 24 hours 07/16/22 1738 07/18/22 1002   07/16/22 1738  vancomycin variable dose per unstable renal function (pharmacist dosing)  Status:  Discontinued         Does not apply See admin instructions 07/16/22 1738 07/17/22 1710   07/16/22 1215  vancomycin (VANCOREADY) IVPB 1250 mg/250 mL        1,250 mg 166.7 mL/hr over 90 Minutes Intravenous  Once 07/16/22 1134 07/17/22 1725   07/16/22 1130  piperacillin-tazobactam (ZOSYN) IVPB 3.375 g        3.375 g 100 mL/hr over 30 Minutes Intravenous  Once 07/16/22 1127 07/16/22 1234        Medications  Scheduled Meds:  Chlorhexidine Gluconate Cloth  6 each Topical Daily   heparin   5,000 Units Subcutaneous Q8H   multivitamin with minerals  1 tablet Oral Daily   mouth rinse  15 mL Mouth Rinse 4 times per day   senna-docusate  2 tablet Oral BID   sodium hypochlorite   Irrigation BID   Continuous Infusions:  sodium chloride 65 mL/hr at 07/18/22 1254   ampicillin-sulbactam (UNASYN) IV     dextrose 75 mL/hr at 07/20/22 0600   vancomycin 1,500 mg (07/19/22 1248)   PRN Meds:.acetaminophen **OR** acetaminophen, haloperidol lactate, ondansetron **OR** ondansetron (ZOFRAN) IV, mouth rinse    Subjective:   [name] was seen and examined today.  No events overnight.   Objective:   Vitals:   07/19/22 2122 07/19/22 2125 07/19/22 2225 07/20/22 0448  BP: 125/68   130/76  Pulse: (!) 102   93  Resp: (!) 31 (!) 26 (!) 25 20  Temp: 98.6 F (37 C)   98.3 F (36.8 C)  TempSrc: Oral   Oral  SpO2: 98%   97%  Weight:      Height:        Intake/Output Summary (Last 24 hours) at 07/20/2022 1015 Last data filed at 07/20/2022 0900 Gross per 24 hour  Intake 2513.14 ml  Output 2976 ml  Net -462.86 ml    Filed Weights   07/16/22 1544  Weight: 62.1 kg     Exam General exam: Ill appearing gentleman , not in distress.  Respiratory system: diminished air entry at bases, on [place] oxygen.  Cardiovascular system: S1 & S2 heard, RRR, no pedal edema.  Gastrointestinal system: Abdomen is soft, NT BS+ Central nervous system: does not follow commands,  Extremities: no pedal edema.  Skin: decubitus ulcer on the sacral coccygeal area.  Psychiatry: cannot be assessed due to advanced dementia.       Data Reviewed:  I have personally reviewed  following labs and imaging studies   CBC Lab Results  Component Value Date   WBC 11.2 (H) 07/19/2022   RBC 3.20 (L) 07/19/2022   HGB 9.2 (L) 07/19/2022   HCT 28.6 (L) 07/19/2022   MCV 89.4 07/19/2022   MCH 28.8 07/19/2022   PLT 158 07/19/2022   MCHC 32.2 07/19/2022   RDW 14.0 07/19/2022   LYMPHSABS 0.7 07/19/2022   MONOABS  0.4 07/19/2022   EOSABS 0.2 07/19/2022   BASOSABS 0.1 07/19/2022     Last metabolic panel Lab Results  Component Value Date   NA 140 07/19/2022   K 3.1 (L) 07/19/2022   CL 115 (H) 07/19/2022   CO2 20 (L) 07/19/2022   BUN 19 07/19/2022   CREATININE 0.73 07/19/2022   GLUCOSE 109 (H) 07/19/2022   GFRNONAA >60 07/19/2022   GFRAA >60 02/04/2020   CALCIUM 7.4 (L) 07/19/2022   PHOS 2.3 (L) 07/18/2022   PROT 4.7 (L) 07/19/2022   ALBUMIN 1.8 (L) 07/19/2022   BILITOT 0.7 07/19/2022   ALKPHOS 48 07/19/2022   AST 109 (H) 07/19/2022   ALT 66 (H) 07/19/2022   ANIONGAP 5 07/19/2022    CBG (last 3)  Recent Labs    07/19/22 1804 07/20/22 0015 07/20/22 0635  GLUCAP 101* 129* 86       Coagulation Profile: Recent Labs  Lab 07/16/22 1126  INR 1.4*      Radiology Studies: No results found.     Kathlen Mody M.D. [facility] 07/20/2022, 10:15 AM  Available via Epic secure chat 7am-7pm After 7 pm, please refer to night coverage provider listed on amion.

## 2022-07-20 NOTE — NC FL2 (Signed)
[place] LEVEL OF CARE SCREENING TOOL     IDENTIFICATION  Patient Name: [name] Birthdate: 10-18-1955 Sex: male Admission Date (Current Location): 07/16/2022  [facility] and [place] Number:  Herbalist and Address:  [facility],  [place] [address] [place]      Provider Number: [id number]  Attending Physician Name and Address:  Hosie Poisson, MD  Relative Name and Phone Number:  [name]([phone]    Current Level of Care: Hospital Recommended Level of Care: [place] Prior Approval Number:    Date Approved/Denied:   PASRR Number:  [id number])  Discharge Plan: SNF    Current Diagnoses: Patient Active Problem List   Diagnosis Date Noted   Osteomyelitis ([place]) 07/19/2022   Aspiration pneumonia of both lower lobes ([place]) 07/19/2022   Sepsis ([place]) 07/19/2022   Sepsis due to undetermined organism ([place]) 07/16/2022   Hypernatremia 07/16/2022   AKI (acute kidney injury) ([place]) 07/16/2022   Leukocytosis 07/16/2022   Stage IV pressure ulcer of sacral region ([place]) 07/16/2022   Alzheimer's dementia ([place]) 07/16/2022   Restlessness 08/24/2018   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 Noted to be height on 10/02/19)  BEHAVIORAL SYMPTOMS/MOOD NEUROLOGICAL BOWEL NUTRITION STATUS      Incontinent Diet (dysphagia 1 nectar thick)  AMBULATORY STATUS COMMUNICATION OF NEEDS Skin   Total Care Verbally         PU Stage 4 Dressing: BID (See wound care orders)               Personal Care Assistance Level of Assistance  Bathing, Feeding, Dressing Bathing Assistance: Maximum assistance Feeding assistance: Limited assistance Dressing Assistance:  Maximum assistance     Functional Limitations Info  Sight, Hearing, Speech Sight Info: Adequate Hearing Info: Adequate Speech Info: Impaired (dysphagia 1)    SPECIAL CARE FACTORS FREQUENCY                       Contractures Contractures Info: Not present    Additional Factors Info  Code Status, Allergies Code Status Info:  (DNR) Allergies Info:  (NKA)           Current Medications (07/20/2022):  This is the current hospital active medication list Current Facility-Administered Medications  Medication Dose Route Frequency Provider Last Rate Last Admin   0.45 % sodium chloride infusion   Intravenous Continuous Roney Jaffe, MD 65 mL/hr at 07/18/22 1254 New Bag at 07/18/22 1254   acetaminophen (TYLENOL) tablet 650 mg  650 mg Oral Q6H PRN Reubin Milan, MD   650 mg at 07/20/22 2440   Or   acetaminophen (TYLENOL) suppository 650 mg  650 mg Rectal Q6H PRN Reubin Milan, MD       Ampicillin-Sulbactam (UNASYN) 3 g in sodium chloride 0.9 % 100 mL IVPB  3 g Intravenous Q6H Cozart, Hannah, RPH       Chlorhexidine Gluconate Cloth 2 % PADS 6 each  6 each Topical Daily Reubin Milan, MD   6 each at 07/20/22 1041   haloperidol lactate (HALDOL) injection 3 mg  3 mg Intravenous Q6H PRN Reubin Milan, MD       morphine CONCENTRATE 10 MG/0.5ML oral solution 20  mg  20 mg Sublingual Q1H PRN Acquanetta Chain, DO       ondansetron [facility]) tablet 4 mg  4 mg Oral Q6H PRN Reubin Milan, MD       Or   ondansetron [facility]) injection 4 mg  4 mg Intravenous Q6H PRN Reubin Milan, MD       Oral care mouth rinse  15 mL Mouth Rinse 4 times per day Hosie Poisson, MD   15 mL at 07/20/22 1245   Oral care mouth rinse  15 mL Mouth Rinse PRN Hosie Poisson, MD       senna-docusate (Senokot-S) tablet 2 tablet  2 tablet Oral BID Hosie Poisson, MD   2 tablet at 07/20/22 0824   sodium hypochlorite (DAKIN'S 1/4 STRENGTH) topical solution   Irrigation BID Hosie Poisson, MD    1 Application at [id number]   vancomycin (VANCOREADY) IVPB 1500 mg/300 mL  1,500 mg Intravenous Q24H Emiliano Dyer, RPH 150 mL/hr at 07/20/22 1256 1,500 mg at 07/20/22 1256     Discharge Medications: Please see discharge summary for a list of discharge medications.  Relevant Imaging Results:  Relevant Lab Results:   Additional Information  Clinical research associate for hospice services @ [place])  Yulissa Needham, [place], [place]

## 2022-07-20 NOTE — Consult Note (Signed)
Consultation Note Date: 07/20/2022   Patient Name: [name]  DOB: 01/29/1956  MRN: [id number]  Age / Sex: 66 y.o., male  PCP: Jonathon Jordan, MD Referring Physician: Hosie Poisson, MD  Reason for Consultation:   HPI/Patient Profile: 66 y.o. male  with past medical history of Advanced Early Alzheimer's Dementia, hyperlipidemia, anxiety, and hypertension admitted on 07/16/2022 with Sepsis. Sepsis secondary to infection of sacrococcygeal decubitus ulcer and LLL pneumonia. Patient followed by ID and Nephrology for antibiotic guidance, as patient also presented with AKI.   Primary Decision Maker NEXT OF KIN wife [name]  Discussion: Discussed with wife Marvin patient's Advanced Alzheimer's Disease and his high risk for sepsis in the future. She verbalizes understanding that there will come a time when antibiotics may not be effective in treating infection. She is appropriately tearful and states that she wants her husband to be comfortable. She explains that patient has been going through this for almost a decade. Emotional support provided through active listening and affirmation that watching her husband's decline over these years has been difficult.  Marvin wants patient to have the "best care possible" with hospice to follow patient when he returns to [place]   -Make comfort care. Discontinue cardiac monitoring and accuchecks. -Would not recommend debridement. -Initiate comfort medications with PRN oral Morphine and Haloperidol for pain and agitation. -Recommend hospice following patient upon his discharge to [facility]  Code Status/Advance Care Planning: DNR   Prognosis:   < 6 months  Discharge Planning: [place] with Hospice  Primary Diagnoses: Present on Admission:  Sepsis due to undetermined organism ([place])  Hypernatremia  AKI (acute  kidney injury) ([place])  Leukocytosis  Stage IV pressure ulcer of sacral region ([place])  GAD (generalized anxiety disorder)  Alzheimer's dementia ([place])   Review of Systems Patient unable to contribute to review of systems. Wife reports that patient is total care. He is dependent on facility staff for feeding him, turning him, and bathing him. Reports that her husband is in need of frequent turning with the pressure ulcer and his skin being so fragile.  Physical Exam Constitutional:      Comments: Makes intermittent eye contact and grunts. Non-verbal. Unable to follow commands.  Pulmonary:     Comments: Shallow, unlabored respirations. Musculoskeletal:     Comments: Contractures of arms and legs.  Neurological:     Mental Status: He is alert.     Comments: Brow is furrowed at beginning of visit. Relaxes with provider holding his hand and talking gently with him.     Vital Signs: BP 130/76 (BP Location: Right Wrist)   Pulse 93   Temp 98.3 F (36.8 C) (Oral)   Resp 20   Ht 5\' 6"  (1.676 m) Comment: per record from [place]. Noted to be height on 10/02/19  Wt 62.1 kg   SpO2 97%   BMI 22.10 kg/m  Pain Scale: PAINAD   Pain Score: 0-No pain   SpO2: SpO2: 97 % O2 Device:SpO2: 97 %  O2 Flow Rate: .O2 Flow Rate (L/min): 2 L/min  IO: Intake/output summary:  Intake/Output Summary (Last 24 hours) at 07/20/2022 0927 Last data filed at 07/20/2022 0858 Gross per 24 hour  Intake 2802.76 ml  Output 2976 ml  Net -173.24 ml    LBM: Last BM Date : 07/19/22 Baseline Weight: Weight: 62.1 kg Most recent weight: Weight: 62.1 kg       Thank you for this consult. Palliative medicine will continue to follow and assist as needed.   Signed by: Moss Mc, RN MSN [facility] / NP Student Palliative Medicine    Please contact Palliative Medicine Team phone at [phone] for questions and concerns.  For individual provider: See Amion   I have seen, examined and discussed this patient's care and  agree with all of the details and recommendations outlined in this note.  Lane Hacker, DO Palliative Medicine   Time: 70 min

## 2022-07-20 NOTE — Care Management Important Message (Signed)
Important Message  Patient Details IM Letter placed in Patient's room. Name: [name] MRN: [id number] Date of Birth: 1956/04/07   Medicare Important Message Given:  Yes     Kerin Salen 07/20/2022, 11:10 AM

## 2022-07-20 NOTE — NC FL2 (Signed)
[place] LEVEL OF CARE SCREENING TOOL     IDENTIFICATION  Patient Name: [name] Birthdate: 14-Apr-1956 Sex: male Admission Date (Current Location): 07/16/2022  [facility] and [place] Number:  Herbalist and Address:  [facility],  [place] [address], [place]      Provider Number: [id number]  Attending Physician Name and Address:  Hosie Poisson, MD  Relative Name and Phone Number:  [name]([phone]    Current Level of Care: Hospital Recommended Level of Care: Other (Comment) ([facility] Prior Approval Number:    Date Approved/Denied:   PASRR Number:  [id number])  Discharge Plan: Other (Comment) (Hospice)    Current Diagnoses: Patient Active Problem List   Diagnosis Date Noted   Osteomyelitis ([place]) 07/19/2022   Aspiration pneumonia of both lower lobes ([place]) 07/19/2022   Sepsis ([place]) 07/19/2022   Sepsis due to undetermined organism ([place]) 07/16/2022   Hypernatremia 07/16/2022   AKI (acute kidney injury) ([place]) 07/16/2022   Leukocytosis 07/16/2022   Stage IV pressure ulcer of sacral region ([place]) 07/16/2022   Alzheimer's dementia ([place]) 07/16/2022   Restlessness 08/24/2018   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 Noted to be height on 10/02/19)  BEHAVIORAL SYMPTOMS/MOOD NEUROLOGICAL BOWEL NUTRITION STATUS      Incontinent Diet (dysphagia 1 nectar thick)  AMBULATORY STATUS COMMUNICATION OF NEEDS Skin   Total Care Verbally         PU Stage 4 Dressing: BID (See wound care orders)               Personal Care Assistance Level of Assistance  Bathing, Feeding, Dressing Bathing Assistance: Maximum assistance Feeding  assistance: Limited assistance Dressing Assistance: Maximum assistance     Functional Limitations Info  Sight, Hearing, Speech Sight Info: Adequate Hearing Info: Adequate Speech Info: Impaired (dysphagia 1)    SPECIAL CARE FACTORS FREQUENCY                       Contractures Contractures Info: Not present    Additional Factors Info  Code Status, Allergies Code Status Info:  (DNR) Allergies Info:  (NKA)           Current Medications (07/20/2022):  This is the current hospital active medication list Current Facility-Administered Medications  Medication Dose Route Frequency Provider Last Rate Last Admin   0.45 % sodium chloride infusion   Intravenous Continuous Roney Jaffe, MD 65 mL/hr at 07/18/22 1254 New Bag at 07/18/22 1254   acetaminophen (TYLENOL) tablet 650 mg  650 mg Oral Q6H PRN Reubin Milan, MD   650 mg at 07/20/22 5852   Or   acetaminophen (TYLENOL) suppository 650 mg  650 mg Rectal Q6H PRN Reubin Milan, MD       Ampicillin-Sulbactam (UNASYN) 3 g in sodium chloride 0.9 % 100 mL IVPB  3 g Intravenous Q6H Cozart, Hannah, RPH       Chlorhexidine Gluconate Cloth 2 % PADS 6 each  6 each Topical Daily Reubin Milan, MD   6 each at 07/20/22 1041   haloperidol lactate (HALDOL) injection 3 mg  3 mg Intravenous Q6H PRN Reubin Milan, MD       morphine CONCENTRATE 10  MG/0.5ML oral solution 20 mg  20 mg Sublingual Q1H PRN Anderson Malta L, DO       ondansetron [facility]) tablet 4 mg  4 mg Oral Q6H PRN Bobette Mo, MD       Or   ondansetron [facility]) injection 4 mg  4 mg Intravenous Q6H PRN Bobette Mo, MD       Oral care mouth rinse  15 mL Mouth Rinse 4 times per day Kathlen Mody, MD   15 mL at 07/20/22 1245   Oral care mouth rinse  15 mL Mouth Rinse PRN Kathlen Mody, MD       senna-docusate (Senokot-S) tablet 2 tablet  2 tablet Oral BID Kathlen Mody, MD   2 tablet at 07/20/22 0824   sodium hypochlorite (DAKIN'S 1/4  STRENGTH) topical solution   Irrigation BID Kathlen Mody, MD   1 Application at 07/20/22 0821   vancomycin (VANCOREADY) IVPB 1500 mg/300 mL  1,500 mg Intravenous Q24H Rollene Fare, RPH 150 mL/hr at 07/20/22 1256 1,500 mg at 07/20/22 1256     Discharge Medications: Please see discharge summary for a list of discharge medications.  Relevant Imaging Results:  Relevant Lab Results:   Additional Information  Database administrator for hospice services @ [place])  Kaelynne Christley, [place], [place]

## 2022-07-20 NOTE — Progress Notes (Addendum)
[facility]  Date of Admission:  07/16/2022           Reason for visit: Follow up on sacral wound infection/OM  Current antibiotics: Unasyn Vancomycin  ASSESSMENT:    66 y.o. 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  He also was found to have left lower lobe pneumonia probably related to aspiration, but is improved to room air this morning.  Evaluated by surgery whom has recommended against debridement and seen by palliative care whom as initiated comfort measures.   #Encephalopathy in the setting of severe Alzheimer's dementia #Bedbound at baseline  RECOMMENDATIONS:    Appreciate surgery and palliative care involvement As focus appears more directed at comfort care we will sign off.  Okay to continue with antibiotics for now and plan for transition to comfort at discharge Will sign off please call as needed   Principal Problem:   Osteomyelitis (HCC) Active Problems:   GAD (generalized anxiety disorder)   Sepsis due to undetermined organism (HCC)   Hypernatremia   AKI (acute kidney injury) (HCC)   Leukocytosis   Stage IV pressure ulcer of sacral region (HCC)   Alzheimer's dementia (HCC)   Aspiration pneumonia of both lower lobes (HCC)   Sepsis (HCC)    MEDICATIONS:    Scheduled Meds:  Chlorhexidine Gluconate Cloth  6 each Topical Daily   mouth rinse  15 mL Mouth Rinse 4 times per day   senna-docusate  2 tablet Oral BID   sodium hypochlorite   Irrigation BID   Continuous Infusions:  sodium chloride 65 mL/hr at 07/18/22 1254   ampicillin-sulbactam (UNASYN) IV     vancomycin 1,500 mg (07/20/22 1256)   PRN Meds:.acetaminophen **OR** acetaminophen, haloperidol  lactate, morphine CONCENTRATE, ondansetron **OR** ondansetron (ZOFRAN) IV, mouth rinse  SUBJECTIVE:   24 hour events:  No acute events Low grade fever WBC elevated   Seen this morning.  Remains contractured in the bed, lying on left side.  On room air.  More alert and responsive today.   Review of Systems  Unable to perform ROS: Dementia      OBJECTIVE:   Blood pressure 120/74, pulse (!) 102, temperature 99.4 F (37.4 C), temperature source Axillary, resp. rate 18, height 5\' 6"  (1.676 m), weight 62.1 kg, SpO2 99 %. Body mass index is 22.1 kg/m.  Physical Exam Constitutional:      Comments: Chronically ill appearing, lying in bed, on left side, NAD.   HENT:     Head: Normocephalic and atraumatic.  Eyes:     Extraocular Movements: Extraocular movements intact.     Conjunctiva/sclera: Conjunctivae normal.  Abdominal:     General: There is no distension.     Palpations: Abdomen is soft.  Musculoskeletal:     Comments: Contractured extremities.  Skin:    General: Skin is warm and dry.  Neurological:     General: No focal deficit present.     Mental Status: Mental status is at baseline.      Lab Results: Lab Results  Component Value Date   WBC 15.8 (H) 07/20/2022   HGB 10.5 (L) 07/20/2022   HCT 32.1 (L) 07/20/2022   MCV 88.4 07/20/2022   PLT 195 07/20/2022    Lab Results  Component Value  Date   NA 140 07/20/2022   K 3.4 (L) 07/20/2022   CO2 22 07/20/2022   GLUCOSE 111 (H) 07/20/2022   BUN 15 07/20/2022   CREATININE 0.75 07/20/2022   CALCIUM 7.9 (L) 07/20/2022   GFRNONAA >60 07/20/2022   GFRAA >60 02/04/2020    Lab Results  Component Value Date   ALT 70 (H) 07/20/2022   AST 83 (H) 07/20/2022   ALKPHOS 62 07/20/2022   BILITOT 0.6 07/20/2022    No results found for: "CRP"  No results found for: "ESRSEDRATE"   I have reviewed the micro and lab results in Epic.  Imaging: No results found.   Imaging independently reviewed in Epic.    [name] [phone] pager 07/20/2022, 3:04 PM

## 2022-07-20 NOTE — Progress Notes (Signed)
Pharmacy Antibiotic Note  FREMAN LAPAGE is a 66 y.o. male admitted on 07/16/2022 with  osteomyelitis .  Pharmacy has been consulted for Unasyn dosing, previously on cefepime.   JS presented to the ED from SNF with a sacral ulcer, fever and AMS. He was on ceftriaxone for 3 days PTA. Imaging is consistent with coccygeal osteomyelitis and local infection. There are no plans for debridement or surgery at this time. WBCs at 15.8 today, down from 17.0 on admission but slightly labile. His Scr has improved to 0.75 today from 4.32 on admission. He was febrile for 1 measurement on 11/6 at 100.9 F.   Plan: Start Unasyn 3g q6 hours , discontinued cefepime Continue Vancomycin 1500mg  q24 hours Follow up culture data, renal function, culture results   Height: 5\' 6"  (167.6 cm) (per record from [facility]. Noted to be height on 10/02/19) Weight: 62.1 kg (136 lb 14.4 oz) IBW/kg (Calculated) : 63.8  Temp (24hrs), Avg:99.3 F (37.4 C), Min:98.3 F (36.8 C), Max:100.9 F (38.3 C)  Recent Labs  Lab 07/16/22 1126 07/17/22 0621 07/17/22 1602 07/18/22 0428 07/18/22 1012 07/18/22 2006 07/19/22 0727  WBC 17.0* 12.8*  --  12.9*  --   --  11.2*  CREATININE 4.32* 4.02* 3.32* 1.94*  --  1.21 0.73  VANCORANDOM  --   --   --   --  8  --   --     Estimated Creatinine Clearance: 83 mL/min (by C-G formula based on SCr of 0.73 mg/dL).    No Known Allergies  Antimicrobials this admission: PTA 10/31 CTX 1 gm IM q24 x 3 days> 11/2 11/3 zosyn x1 11/3 cefepime 11/7 11/3 Vanc>> 11/7 Unasyn >>  Dose adjustments this admission: 11/5 Cefepime renally adjusted 11/5 1000 Vanc rand (~44 hours after last dose): 8, restart scheduled vanc per AUC dosing protocol.  11/6 increase vanc 1g q24h for improved SCr  Microbiology results: 11/3 BCx: NG x 4 days 11/3 UCx: NG  11/4 MRSA PCR: Not detected 11/6: Bcx: NG < 12 hours  Sputum culture: sent   Thank you for allowing pharmacy to be a part of this patient's  care.  Vicenta Dunning, PharmD  PGY1 Pharmacy Resident

## 2022-07-21 DIAGNOSIS — M869 Osteomyelitis, unspecified: Secondary | ICD-10-CM | POA: Diagnosis not present

## 2022-07-21 LAB — CULTURE, BLOOD (ROUTINE X 2)
Culture: NO GROWTH
Culture: NO GROWTH
Special Requests: ADEQUATE
Special Requests: ADEQUATE

## 2022-07-21 LAB — GLUCOSE, CAPILLARY
Glucose-Capillary: 109 mg/dL — ABNORMAL HIGH (ref 70–99)
Glucose-Capillary: 123 mg/dL — ABNORMAL HIGH (ref 70–99)
Glucose-Capillary: 124 mg/dL — ABNORMAL HIGH (ref 70–99)

## 2022-07-21 NOTE — TOC Transition Note (Addendum)
Transition of Care [facility]) - CM/SW Discharge Note   Patient Details  Name: [name] MRN: [id number] Date of Birth: 10/24/1955  [facility] CM/SW Contact:  Lanier Clam, RN Phone Number: 07/21/2022, 10:44 AM   Clinical Narrative: d/c today back to [facility] benefit,air mattress ordered per rep Kristal-able to accept today. Await d/c summary prior rm#,tel# report. [name](spouse)wants to come to hospital after work to see patient prior d/c-MD/nsg aware. PTAR  for d/c. -2:23p going to rm#[id number],report tel#[phone]. PTAR called. No further CM needs.    -4p-PTAR called again-will p/u within the hr-Nsg aware. Also contacted rep Gunnar Fusi @ [facility] to facilitate nurse report-provided her with Miranda's tel# to give to their nurse @ [facility].No further CM needs.   Final next level of care: Long Term Nursing Home Barriers to Discharge: No Barriers Identified   Patient Goals and CMS Choice Patient states their goals for this hospitalization and ongoing recovery are::  (Return [facility]-L[facility]) CMS Medicare.gov Compare Post Acute Care list provided to:: Patient Represenative (must comment) ([name](spouse)) Choice offered to / list presented to : Spouse  Discharge Placement              Patient chooses bed at: [facility] Patient to be transferred to facility by:  Sharin Mons) Name of family member notified:  ([name](spouse)) Patient and family notified of of transfer: 07/21/22  Discharge Plan and Services   Discharge Planning Services: CM Consult                                 Social Determinants of Health (SDOH) Interventions     Readmission Risk Interventions     No data to display

## 2022-07-21 NOTE — Plan of Care (Signed)
  Problem: Education: Goal: Knowledge of General Education information will improve Description: Including pain rating scale, medication(s)/side effects and non-pharmacologic comfort measures Outcome: Adequate for Discharge   Problem: Health Behavior/Discharge Planning: Goal: Ability to manage health-related needs will improve Outcome: Adequate for Discharge   Problem: Clinical Measurements: Goal: Ability to maintain clinical measurements within normal limits will improve Outcome: Adequate for Discharge Goal: Will remain free from infection Outcome: Adequate for Discharge Goal: Diagnostic test results will improve Outcome: Adequate for Discharge Goal: Respiratory complications will improve Outcome: Adequate for Discharge Goal: Cardiovascular complication will be avoided Outcome: Adequate for Discharge   Problem: Activity: Goal: Risk for activity intolerance will decrease Outcome: Adequate for Discharge   Problem: Nutrition: Goal: Adequate nutrition will be maintained Outcome: Adequate for Discharge   Problem: Coping: Goal: Level of anxiety will decrease Outcome: Adequate for Discharge   Problem: Elimination: Goal: Will not experience complications related to bowel motility Outcome: Adequate for Discharge Goal: Will not experience complications related to urinary retention Outcome: Adequate for Discharge   Problem: Pain Managment: Goal: General experience of comfort will improve Outcome: Adequate for Discharge   Problem: Safety: Goal: Ability to remain free from injury will improve Outcome: Adequate for Discharge   Problem: Skin Integrity: Goal: Risk for impaired skin integrity will decrease Outcome: Adequate for Discharge   Problem: SLP Dysphagia Goals Goal: Patient will utilize recommended strategies Description: Patient will utilize recommended strategies during swallow to increase swallowing safety with Outcome: Adequate for Discharge   Problem: Increased  Nutrient Needs (NI-5.1) Goal: Food and/or nutrient delivery Description: Individualized approach for food/nutrient provision. Outcome: Adequate for Discharge

## 2022-07-21 NOTE — Progress Notes (Signed)
Patient transported back to [facility] via [facility]. Discharge packet sent with PTAR.  Kaitlynn Tramontana, Kae Heller, RN

## 2022-07-21 NOTE — Progress Notes (Signed)
Attempted to call report to [facility], after call was finally answered was transferred 2 more times with no answer. Unable to give report.

## 2022-07-21 NOTE — Discharge Instructions (Signed)
Patient is being discharged to [facility] for comfort care.

## 2022-07-21 NOTE — Progress Notes (Signed)
Report given to nurse Tammy at [facility], family aware of d/c plan.  Tonji Elliff, Kae Heller, RN

## 2022-07-21 NOTE — Discharge Summary (Signed)
Physician Discharge Summary  [name] ZOX:[id number] DOB: Jul 03, 1956 DOA: 07/16/2022  PCP: Marvin PalmerWolters, Sharon, MD  Admit date: 07/16/2022  Discharge date: 07/21/2022  Admitted From: Home.  Disposition:  [facility] ( Comfort care)  Recommendations for Outpatient Follow-up:  Follow up with PCP in 1-2 weeks. Please obtain BMP/CBC in one week. Patient is being [facility].  Home Health: None Equipment/Devices:None  Discharge Condition: Stable CODE STATUS:DNR Diet recommendation: Comfort feeds.  Brief Summary/ Hospital Course: [name] is a 66 y.o. male with medical history significant of advanced Alzheimer's dementia, anxiety, hypertension, hyperlipidemia who is brought from his facility due to fever for the past 2 days associated with decreased level of consciousness.  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  No drainable abscess observed.  Left lower lobe infiltrate and small left pleural effusion.  There is mucus in the trachea and both mainstem bronchi nearly completely filling the bronchus intermedius and plugging the right lower lobe bronchi.  He was admitted for sepsis secondary to infected sacro coccygeal decubitus ulcer and coccygeal OM and LLL pneumonia, along with AKI.  He was started on broad spectrum IV antibiotics, general surgery, ID and nephrology consulted for recommendations.  Patient has poor prognosis. Palliative care consulted.  Outcome discussed with patient and the family.  Care transitioned to full comfort care.  Patient is being [facility].   Discharge Diagnoses:  Principal Problem:   Osteomyelitis of coccyx (HCC) Active Problems:   GAD (generalized anxiety disorder)   Sepsis due to undetermined organism (HCC)   Hypernatremia   AKI (acute kidney injury) (HCC)   Leukocytosis   Stage IV pressure  ulcer of sacral region (HCC)   Alzheimer's dementia (HCC)   Aspiration pneumonia of both lower lobes (HCC)   Sepsis (HCC)   Sepsis secondary to infected sacro coccygeal decubitus ulcer with coccygeal OM and left lower lobe pneumonia.  Sepsis physiology has improved.  Currently requiring about 2 to 3 lit of [place] oxygen.  Started him on IV vancomycin and IV cefepime transitioned to unasyn and vancomycin.   Follow up blood cultures. Follow pro calcitonin.  COVID PCR and influenza PCR is negative.  Urine for strep pneumonia  is negative  Sputum cultures ordered, if able to obtain.  Surgery consulted ,  recommended no surgical debridement at this time.  Requested ID consult  for the duration of the antibiotics.  Improving leukocytosis but continues to be febrile.  Care transitioned to full comfort care.   Acute metabolic encephalopathy in the setting of severe Alzheimer dementia.  - from AKI, Uremia and hypernatremia and sepsis from OM of the coccygeal area and LLLpneumonia.  - TSH , vit b12 and folic acid level are unremarkable.  Slp evaluation ordered and recommended dysphagia 1 diet.  - patient's mental status appears to be at baseline as per the wife.      AKI with AG metabolic acidosis:  Suspect from poor oral intake and sepsis.  Nephrology on board and unfortunately patient is not a candidate for HD.  Creatinine improved from 3.32 to 1.94 to back to baseline with IV fluids.  Foley catheter placed for measuring urine output./ comfort   Hypernatremia: from free fluid deficit.  Admitted with a sodium of 167 , improved to 156 tp 146 to 140.  Dextrose fluids discontinued.    Hypokalemia:  Replaced. Continue to monitor.    Alzheimer's  Dementia:  Started 9 years ago and has been bed bound for the last 2 years.    Normocytic Anemia: baseline hemoglobin 3 years ago was around 12.  Hemoglobin slowly dropping, 14( hemo concentrated sample from dehydration ) to   10.3to 9.3 . Continue  to monitor.  Anemia panel reviewed.  Suspect drop of hemoglobin from hemodilution.   Looks like his new baseline hemoglobin appears to be around 9.      Mild thrombocytopenia:  From sepsis? Much improved.      Elevated transaminases:  - suspect form hypotension, sepsis. Liver enzymes are improving.  - recheck liver panel in one week.      In view of advanced dementia, poor functional status, palliative care consulted for goals of care.  Wife is requesting for possible hospice to follow up at the SNF.  Palliative team on board and appreciate recommendations.  Care transitioned to full comfort care.     Pressure injury:  RN Pressure Injury Documentation:     Pressure Injury 07/16/22 Sacrum Medial Unstageable - Full thickness tissue loss in which the base of the injury is covered by slough (yellow, tan, gray, green or brown) and/or eschar (tan, brown or black) in the wound bed. Sloughs, eschar, drainage and o (Active)  07/16/22 2209  Location: Sacrum  Location Orientation: Medial  Staging: Unstageable - Full thickness tissue loss in which the base of the injury is covered by slough (yellow, tan, gray, green or brown) and/or eschar (tan, brown or black) in the wound bed.  Wound Description (Comments): Sloughs, eschar, drainage and odor  Present on Admission: Yes  Dressing Type Dakin's-soaked gauze;Gauze (Comment);Foam - Lift dressing to assess site every shift 07/19/22 2245     Pressure Injury 07/17/22 Heel Left Deep Tissue Pressure Injury - Purple or maroon localized area of discolored intact skin or blood-filled blister due to damage of underlying soft tissue from pressure and/or shear. (Active)  07/17/22 0800  Location: Heel  Location Orientation: Left  Staging: Deep Tissue Pressure Injury - Purple or maroon localized area of discolored intact skin or blood-filled blister due to damage of underlying soft tissue from pressure and/or shear.  Wound Description (Comments):    Present on Admission:     Wound care consulted, rcommendations for [place] wet to dry for 3 days, and transition to NS wet to dry after.    Estimated body mass index is 22.1 kg/m as calculated from the following:   Height as of this encounter:  (1.676 m).   Weight as of this encounter: 62.1 kg.      Allergies as of 07/21/2022   No Known Allergies      Medication List     STOP taking these medications    ascorbic acid 500 MG tablet Commonly known as: VITAMIN C   cefTRIAXone 1 g injection Commonly known as: ROCEPHIN   Cholecalciferol 25 MCG (1000 UT) capsule   cyanocobalamin 1000 MCG tablet Commonly known as: VITAMIN B12   divalproex 125 MG DR tablet Commonly known as: DEPAKOTE   memantine 10 MG tablet Commonly known as: NAMENDA   metoprolol succinate 25 MG 24 hr tablet Commonly known as: TOPROL-XL   MULTIVITAL PO   PRO-STAT PO   ZINC OXIDE EX   zinc sulfate 220 (50 Zn) MG capsule       TAKE these medications    morphine CONCENTRATE 10 MG/0.5ML Soln concentrated solution Place 1 mL (20 mg total) under the tongue every hour as needed for  moderate pain or shortness of breath (AGITATION).               Durable Medical Equipment  (From admission, onward)           Start     Ordered   07/20/22 1454  For home use only DME Air overlay mattress  Once        07/20/22 1453              Discharge Care Instructions  (From admission, onward)           Start     Ordered   07/21/22 0000  Discharge wound care:       Comments: [facility] at [facility].   07/21/22 1034            Follow-up Information     Marvin Palmer, MD Follow up in 1 week(s).   Specialty: Family Medicine Contact information: [address] [place] [address] [phone]                No Known Allergies  Consultations: PCCM  ID General surgery   Procedures/Studies: CT CHEST ABDOMEN PELVIS WO  CONTRAST  Result Date: 07/16/2022 CLINICAL DATA:  Sepsis.  Dementia. EXAM: CT CHEST, ABDOMEN AND PELVIS WITHOUT CONTRAST TECHNIQUE: Multidetector CT imaging of the chest, abdomen and pelvis was performed following the standard protocol without IV contrast. RADIATION DOSE REDUCTION: This exam was performed according to the departmental dose-optimization program which includes automated exposure control, adjustment of the mA and/or kV according to patient size and/or use of iterative reconstruction technique. COMPARISON:  Chest radiograph 07/16/2022 FINDINGS: Despite efforts by the technologist and patient, motion artifact is present on today's exam and could not be eliminated. This reduces exam sensitivity and specificity. CT CHEST FINDINGS Cardiovascular: Left anterior descending and right coronary artery atherosclerotic calcification along with thoracic aortic atherosclerotic calcification. Mediastinum/Nodes: Unremarkable Lungs/Pleura: Left lower lobe airspace opacity and small left pleural effusion. Left lower lobe pneumonia is a distinct possibility. Atelectasis and minimal posteromedial airspace opacity peripherally in the right lower lobe, cannot exclude a component of right lower lobe pneumonia. There is mucus in the trachea and both mainstem bronchi, nearly completely filling the bronchus intermedius and plugging the right lower lobe bronchi. Musculoskeletal: Healed deformities of left posteromedial ribs compatible with old healed rib fractures. CT ABDOMEN PELVIS FINDINGS Hepatobiliary: Scattered fluid density lesions in the left hepatic lobe to a lesser degree in the right hepatic lobe, likely cysts. Index lesion in segment 4 of the liver measures 3.2 by 1.9 cm. Gallbladder unremarkable. Pancreas: Unremarkable Spleen: Unremarkable Adrenals/Urinary Tract: Foley catheter in the otherwise empty urinary bladder. The kidneys and adrenal glands appear normal. Stomach/Bowel: Prominent stool throughout the colon  favors constipation. Moderate prominence of stool in the rectal vault, no perirectal stranding to suggest stercoral colitis. Vascular/Lymphatic: Atherosclerosis is present, including aortoiliac atherosclerotic disease. No pathologic adenopathy observed. Reproductive: Unremarkable Other: No supplemental non-categorized findings. Musculoskeletal: Sacrococcygeal decubitus ulcer with abdomen sphere gas or soft tissue gas extending to the periosteal margin of the posterior coccyx as on images 114 through 119 of series 5. There is also a small amount of abnormal pre coccygeal gas as well as presacral and pre coccygeal edema favoring coccygeal osteomyelitis and local infection. No drainable abscess observed. 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 IMPRESSION: 1. 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 No drainable abscess observed. 2.  Left lower lobe airspace opacity and small left pleural effusion. Left lower lobe pneumonia is a distinct possibility. There is mucus in the trachea and both mainstem bronchi, nearly completely filling the bronchus intermedius and plugging the right lower lobe bronchi. There is also some atelectasis and minimal posteromedial airspace opacity in the right lower lobe, cannot exclude a component of right lower lobe pneumonia. 3. Prominent stool throughout the colon favors constipation. Moderate prominence of stool in the rectal vault, no perirectal stranding to suggest stercoral colitis. 4. 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 5. Aortic and coronary atherosclerosis. Aortic Atherosclerosis (ICD10-I70.0). Electronically Signed   By: Gaylyn Rong M.D.   On: 07/16/2022 14:13   DG Chest Marvin 1 View  Result Date: 07/16/2022 CLINICAL DATA:  Sepsis EXAM: PORTABLE CHEST 1 VIEW COMPARISON:  Prior chest x-ray 06/15/2021 FINDINGS: Low inspiratory  volumes. Airspace opacity in the left retrocardiac region obscures the medial hemidiaphragm. Chronic atelectasis versus scarring in the lingula appears similar. The remaining lungs are clear. Cardiac and mediastinal contours are within normal limits. No pneumothorax. No acute osseous abnormality. Chronic elevation of the left hemidiaphragm anteriorly. IMPRESSION: 1. Left lower lobe retrocardiac airspace opacity may represent pneumonia in the setting of sepsis. 2. Similar appearance of chronic scarring versus atelectasis in the lingula with associated elevation of the left hemidiaphragm. Electronically Signed   By: Malachy Moan M.D.   On: 07/16/2022 12:07    Subjective: Patient was seen and examined at bedside.  Overnight events noted. Patient seems at his baseline.  Patient is being discharged to [facility] for comfort care today.  Discharge Exam: Vitals:   07/21/22 0615 07/21/22 1227  BP: 102/81 123/69  Pulse: 94 98  Resp: 16 17  Temp: 99 F (37.2 C) 97.9 F (36.6 C)  SpO2: 94% 96%   Vitals:   07/20/22 1322 07/20/22 1953 07/21/22 0615 07/21/22 1227  BP: 120/74 100/60 102/81 123/69  Pulse: (!) 102 (!) 55 94 98  Resp: 18 16 16 17   Temp: 99.4 F (37.4 C) 99.7 F (37.6 C) 99 F (37.2 C) 97.9 F (36.6 C)  TempSrc: Axillary Oral Oral Oral  SpO2: 99% 93% 94% 96%  Weight:      Height:        General: Pt is alert, awake, not in acute distress Cardiovascular: RRR, S1/S2 +, no rubs, no gallops Respiratory: Decreased breath sounds, on nasal cannula oxygen. Abdominal: Soft, NT, ND, bowel sounds + Extremities: no edema, no cyanosis : Skin: Decubitus ulcer on the sacrococcygeal region.    The results of significant diagnostics from this hospitalization (including imaging, microbiology, ancillary and laboratory) are listed below for reference.     Microbiology: Recent Results (from the past 240 hour(s))  Culture, blood (routine x 2)     Status: None   Collection Time: 07/16/22  11:25 AM   Specimen: BLOOD RIGHT HAND  Result Value Ref Range Status   Specimen Description   Final    BLOOD RIGHT HAND Performed at [facility], [address], [place], [place]    Special Requests   Final    BOTTLES DRAWN AEROBIC AND ANAEROBIC Blood Culture adequate volume Performed at [facility], [address] [place], [place]    Culture   Final    NO GROWTH 5 DAYS Performed at [facility], [address] [place], [place]    Report Status  07/21/2022 FINAL  Final  Culture, blood (routine x 2)     Status: None   Collection Time: 07/16/22 11:36 AM   Specimen: BLOOD  Result Value Ref Range Status   Specimen Description   Final    BLOOD RIGHT ANTECUBITAL Performed at [facility], [address], [place], [place] [address]    Special Requests   Final    BOTTLES DRAWN AEROBIC AND ANAEROBIC Blood Culture adequate volume Performed at [facility], [address] [place], [place] [address]    Culture   Final    NO GROWTH 5 DAYS Performed at [facility], [address], [place], [place] [address]    Report Status 07/21/2022 FINAL  Final  Urine Culture     Status: None   Collection Time: 07/16/22 12:28 PM   Specimen: Urine, Clean Catch  Result Value Ref Range Status   Specimen Description   Final    URINE, CLEAN CATCH Performed at [facility], [address] [place], [place] [address]    Special Requests   Final    NONE Performed at [facility], [address], [place], [place] [address]    Culture   Final    NO GROWTH Performed at [facility], [address], [place], [place] [address]    Report Status 07/17/2022 FINAL  Final  Resp Panel by RT-PCR (Flu A&B, Covid) Anterior Nasal Swab     Status: None   Collection Time: 07/16/22  4:32 PM   Specimen: Anterior Nasal Swab  Result Value Ref Range Status   SARS  Coronavirus 2 by RT PCR NEGATIVE NEGATIVE Final    Comment: (NOTE) SARS-CoV-2 target nucleic acids are NOT DETECTED.  The SARS-CoV-2 RNA is generally detectable in upper respiratory specimens during the acute phase of infection. The lowest concentration of SARS-CoV-2 viral copies this assay can detect is 138 copies/mL. A negative result does not preclude SARS-Cov-2 infection and should not be used as the sole basis for treatment or other patient management decisions. A negative result may occur with  improper specimen collection/handling, submission of specimen other than nasopharyngeal swab, presence of viral mutation(s) within the areas targeted by this assay, and inadequate number of viral copies(<138 copies/mL). A negative result must be combined with clinical observations, patient history, and epidemiological information. The expected result is Negative.  Fact Sheet for Patients:  BloggerCourse.com  Fact Sheet for Healthcare Providers:  SeriousBroker.it  This test is no t yet approved or cleared by the [place] FDA and  has been authorized for detection and/or diagnosis of SARS-CoV-2 by FDA under an Emergency Use Authorization (EUA). This EUA will remain  in effect (meaning this test can be used) for the duration of the COVID-19 declaration under Section 564(b)(1) of the Act, 21 U.S.C.section 360bbb-3(b)(1), unless the authorization is terminated  or revoked sooner.       Influenza A by PCR NEGATIVE NEGATIVE Final   Influenza B by PCR NEGATIVE NEGATIVE Final    Comment: (NOTE) The Xpert Xpress SARS-CoV-2/FLU/RSV plus assay is intended as an aid in the diagnosis of influenza from Nasopharyngeal swab specimens and should not be used as a sole basis for treatment. Nasal washings and aspirates are unacceptable for Xpert Xpress SARS-CoV-2/FLU/RSV testing.  Fact Sheet for  Patients: BloggerCourse.com  Fact Sheet for Healthcare Providers: SeriousBroker.it  This test is not yet approved or cleared by the [place] and has been authorized for  detection and/or diagnosis of SARS-CoV-2 by FDA under an Emergency Use Authorization (EUA). This EUA will remain in effect (meaning this test can be used) for the duration of the COVID-19 declaration under Section 564(b)(1) of the Act, 21 U.S.C. section 360bbb-3(b)(1), unless the authorization is terminated or revoked.  Performed at [facility], [address] [place], [place] [address]   MRSA Next Gen by PCR, Nasal     Status: None   Collection Time: 07/17/22  6:31 AM   Specimen: Nasal Mucosa; Nasal Swab  Result Value Ref Range Status   MRSA by PCR Next Gen NOT DETECTED NOT DETECTED Final    Comment: (NOTE) The GeneXpert MRSA Assay (FDA approved for NASAL specimens only), is one component of a comprehensive MRSA colonization surveillance program. It is not intended to diagnose MRSA infection nor to guide or monitor treatment for MRSA infections. Test performance is not FDA approved in patients less than 10 years old. Performed at [facility], [address], [place], [place] [address]   Culture, blood (Routine X 2) w Reflex to ID Panel     Status: None (Preliminary result)   Collection Time: 07/19/22  4:26 PM   Specimen: BLOOD LEFT HAND  Result Value Ref Range Status   Specimen Description   Final    BLOOD LEFT HAND IN BOTH AEROBIC AND ANAEROBIC BOTTLES Performed at [facility], [address], [place], [place] [address]    Special Requests   Final    Blood Culture adequate volume Performed at [facility], [address] [place], [place] [address]    Culture   Final    NO GROWTH 2 DAYS Performed at [facility], [address] [place], [place] [address]    Report Status  PENDING  Incomplete  Culture, blood (Routine X 2) w Reflex to ID Panel     Status: None (Preliminary result)   Collection Time: 07/19/22  4:28 PM   Specimen: BLOOD LEFT HAND  Result Value Ref Range Status   Specimen Description   Final    BLOOD LEFT HAND Performed at [facility], [address] [place], [place] [address]    Special Requests   Final    BOTTLES DRAWN AEROBIC AND ANAEROBIC Blood Culture adequate volume Performed at [facility], [address] [place], [place] [address]    Culture   Final    NO GROWTH 2 DAYS Performed at [facility], [address] [place], [place] [address]    Report Status PENDING  Incomplete     Labs: BNP (last 3 results) Recent Labs    07/16/22 1127  BNP 144.4*   Basic Metabolic Panel: Recent Labs  Lab 07/17/22 1602 07/18/22 0428 07/18/22 2006 07/19/22 0727 07/20/22 1002  NA 156* 146* 145 140 140  K 3.4* 3.1* 3.5 3.1* 3.4*  CL 126* 123* 122* 115* 109  CO2 20* 20* 18* 20* 22  GLUCOSE 106* 140* 114* 109* 111*  BUN 69* 47* 29* 19 15  CREATININE 3.32* 1.94* 1.21 0.73 0.75  CALCIUM 7.4* 7.2* 7.4* 7.4* 7.9*  MG  --  2.4  --   --  2.0  PHOS  --  2.3*  --   --  1.7*   Liver Function Tests: Recent Labs  Lab 07/16/22 1126 07/17/22 0621 07/18/22 0428 07/19/22 0727 07/20/22 1002  AST 78* 253* 177* 109* 83*  ALT <5 61* 71* 66* 70*  ALKPHOS 63 40 42 48 62  BILITOT 0.9 0.6 0.4 0.7 0.6  PROT 7.4 5.0* 4.6* 4.7* 5.5*  ALBUMIN 2.7* 1.9* 1.8* 1.8* 2.1*   Recent Labs  Lab 07/16/22 1126  LIPASE 43   No results for input(s): "AMMONIA" in the last 168 hours. CBC: Recent Labs  Lab 07/16/22 1126 07/17/22 0621 07/18/22 0428 07/19/22 0727 07/20/22 1002  WBC 17.0* 12.8* 12.9* 11.2* 15.8*  NEUTROABS 14.4*  --  11.1* 9.6* 13.7*  HGB 14.1 10.3* 9.3* 9.2* 10.5*  HCT 47.7 35.5* 30.6* 28.6* 32.1*  MCV 97.1 99.7 93.3 89.4 88.4  PLT 241 127* 152 158 195   Cardiac Enzymes: No results for input(s):  "CKTOTAL", "CKMB", "CKMBINDEX", "TROPONINI" in the last 168 hours. BNP: Invalid input(s): "POCBNP" CBG: Recent Labs  Lab 07/20/22 1140 07/20/22 1738 07/21/22 0011 07/21/22 0610 07/21/22 1226  GLUCAP 106* 119* 123* 124* 109*   D-Dimer No results for input(s): "DDIMER" in the last 72 hours. Hgb A1c No results for input(s): "HGBA1C" in the last 72 hours. Lipid Profile No results for input(s): "CHOL", "HDL", "LDLCALC", "TRIG", "CHOLHDL", "LDLDIRECT" in the last 72 hours. Thyroid function studies No results for input(s): "TSH", "T4TOTAL", "T3FREE", "THYROIDAB" in the last 72 hours.  Invalid input(s): "FREET3" Anemia work up No results for input(s): "VITAMINB12", "FOLATE", "FERRITIN", "TIBC", "IRON", "RETICCTPCT" in the last 72 hours. Urinalysis    Component Value Date/Time   COLORURINE AMBER (A) 07/16/2022 1228   APPEARANCEUR HAZY (A) 07/16/2022 1228   LABSPEC 1.025 07/16/2022 1228   PHURINE 5.0 07/16/2022 1228   GLUCOSEU NEGATIVE 07/16/2022 1228   HGBUR NEGATIVE 07/16/2022 1228   BILIRUBINUR NEGATIVE 07/16/2022 1228   KETONESUR 5 (A) 07/16/2022 1228   PROTEINUR 30 (A) 07/16/2022 1228   UROBILINOGEN 0.2 04/04/2011 1247   NITRITE NEGATIVE 07/16/2022 1228   LEUKOCYTESUR NEGATIVE 07/16/2022 1228   Sepsis Labs Recent Labs  Lab 07/17/22 0621 07/18/22 0428 07/19/22 0727 07/20/22 1002  WBC 12.8* 12.9* 11.2* 15.8*   Microbiology Recent Results (from the past 240 hour(s))  Culture, blood (routine x 2)     Status: None   Collection Time: 07/16/22 11:25 AM   Specimen: BLOOD RIGHT HAND  Result Value Ref Range Status   Specimen Description   Final    BLOOD RIGHT HAND Performed at [facility], [address], [place], [place] [address]    Special Requests   Final    BOTTLES DRAWN AEROBIC AND ANAEROBIC Blood Culture adequate volume Performed at [facility], [address] [place], [place] [address]    Culture   Final    NO GROWTH 5  DAYS Performed at [facility], [address], [place], [place] [address]    Report Status 07/21/2022 FINAL  Final  Culture, blood (routine x 2)     Status: None   Collection Time: 07/16/22 11:36 AM   Specimen: BLOOD  Result Value Ref Range Status   Specimen Description   Final    BLOOD RIGHT ANTECUBITAL Performed at [facility], [address] [place], [place] [address]    Special Requests   Final    BOTTLES DRAWN AEROBIC AND ANAEROBIC Blood Culture adequate volume Performed at [facility], [address], [place], [place] [address]    Culture   Final    NO GROWTH 5 DAYS Performed at [facility], [address], [place], [place] [address]    Report Status 07/21/2022 FINAL  Final  Urine Culture     Status: None   Collection Time: 07/16/22 12:28 PM   Specimen: Urine, Clean Catch  Result Value Ref Range Status   Specimen Description   Final    URINE, CLEAN CATCH Performed at [facility], [address] [place], [place] [address]    Special Requests   Final    NONE Performed at [facility], [address], [place], [place] [address]    Culture   Final    NO GROWTH Performed at [facility], [address] [place], [place] [address]    Report Status 07/17/2022 FINAL  Final  Resp Panel by RT-PCR (Flu A&B, Covid) Anterior Nasal Swab     Status: None   Collection Time: 07/16/22  4:32 PM   Specimen: Anterior Nasal Swab  Result Value Ref Range Status   SARS Coronavirus 2 by RT PCR NEGATIVE NEGATIVE Final    Comment: (NOTE) SARS-CoV-2 target nucleic acids are NOT DETECTED.  The SARS-CoV-2 RNA is generally detectable in upper respiratory specimens during the acute phase of infection. The lowest concentration of SARS-CoV-2 viral copies this assay can detect is 138 copies/mL. A negative result does not preclude SARS-Cov-2 infection and should not be used as the sole basis for treatment  or other patient management decisions. A negative result may occur with  improper specimen collection/handling, submission of specimen other than nasopharyngeal swab, presence of viral mutation(s) within the areas targeted by this assay, and inadequate number of viral copies(<138 copies/mL). A negative result must be combined with clinical observations, patient history, and epidemiological information. The expected result is Negative.  Fact Sheet for Patients:  BloggerCourse.com  Fact Sheet for Healthcare Providers:  SeriousBroker.it  This test is no t yet approved or cleared by the [place] FDA and  has been authorized for detection and/or diagnosis of SARS-CoV-2 by FDA under an Emergency Use Authorization (EUA). This EUA will remain  in effect (meaning this test can be used) for the duration of the COVID-19 declaration under Section 564(b)(1) of the Act, 21 U.S.C.section 360bbb-3(b)(1), unless the authorization is terminated  or revoked sooner.       Influenza A by PCR NEGATIVE NEGATIVE Final   Influenza B by PCR NEGATIVE NEGATIVE Final    Comment: (NOTE) The Xpert Xpress SARS-CoV-2/FLU/RSV plus assay is intended as an aid in the diagnosis of influenza from Nasopharyngeal swab specimens and should not be used as a sole basis for treatment. Nasal washings and aspirates are unacceptable for Xpert Xpress SARS-CoV-2/FLU/RSV testing.  Fact Sheet for Patients: BloggerCourse.com  Fact Sheet for Healthcare Providers: SeriousBroker.it  This test is not yet approved or cleared by the [place] FDA and has been authorized for detection and/or diagnosis of SARS-CoV-2 by FDA under an Emergency Use Authorization (EUA). This EUA will remain in effect (meaning this test can be used) for the duration of the COVID-19 declaration under Section 564(b)(1) of the Act, 21 U.S.C. section  360bbb-3(b)(1), unless the authorization is terminated or revoked.  Performed at [facility], [address] [place], [place] [address]   MRSA Next Gen by PCR, Nasal     Status: None   Collection Time: 07/17/22  6:31 AM   Specimen: Nasal Mucosa; Nasal Swab  Result Value Ref Range Status   MRSA by PCR Next Gen NOT DETECTED NOT DETECTED Final    Comment: (NOTE) The GeneXpert MRSA Assay (FDA approved for NASAL specimens only), is one component of  a comprehensive MRSA colonization surveillance program. It is not intended to diagnose MRSA infection nor to guide or monitor treatment for MRSA infections. Test performance is not FDA approved in patients less than 40 years old. Performed at [facility], [address] [place], [place] [address]   Culture, blood (Routine X 2) w Reflex to ID Panel     Status: None (Preliminary result)   Collection Time: 07/19/22  4:26 PM   Specimen: BLOOD LEFT HAND  Result Value Ref Range Status   Specimen Description   Final    BLOOD LEFT HAND IN BOTH AEROBIC AND ANAEROBIC BOTTLES Performed at [facility], [address] [place], [place] [address]    Special Requests   Final    Blood Culture adequate volume Performed at [facility], [address], [place], [place] [address]    Culture   Final    NO GROWTH 2 DAYS Performed at [facility], [address] [place], [place] [address]    Report Status PENDING  Incomplete  Culture, blood (Routine X 2) w Reflex to ID Panel     Status: None (Preliminary result)   Collection Time: 07/19/22  4:28 PM   Specimen: BLOOD LEFT HAND  Result Value Ref Range Status   Specimen Description   Final    BLOOD LEFT HAND Performed at [facility], [address] [place], [place] [address]    Special Requests   Final    BOTTLES DRAWN AEROBIC AND ANAEROBIC Blood Culture adequate volume Performed at [facility], [address], [place], [place] [address]    Culture   Final    NO GROWTH 2 DAYS Performed at [facility], [address] [place], [place] [address]    Report Status PENDING  Incomplete     Time coordinating discharge: Over 30 minutes  SIGNED:   Cipriano Bunker, MD  [facility] 07/21/2022, 1:02 PM Pager   If 7PM-7AM, please contact night-coverage

## 2022-07-24 LAB — CULTURE, BLOOD (ROUTINE X 2)
Culture: NO GROWTH
Culture: NO GROWTH
Special Requests: ADEQUATE
Special Requests: ADEQUATE

## 2022-10-14 DEATH — deceased

## 2023-12-19 IMAGING — CT CT HEAD W/O CM
4 series · 16 of 47 positions shown, 18 images · non-contrast
Comparison: 06/14/2021

CLINICAL DATA: Head and neck trauma, unwitnessed fall out of
wheelchair



[Series 3: head bone · axial · 0.44mm/px · z∈[-92,-60]mm · 3 of 83 slices shown]
[im 9/83  bone]
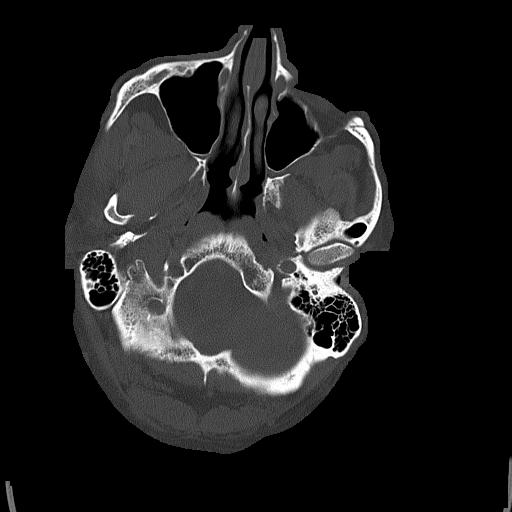
[im 17/83  bone]
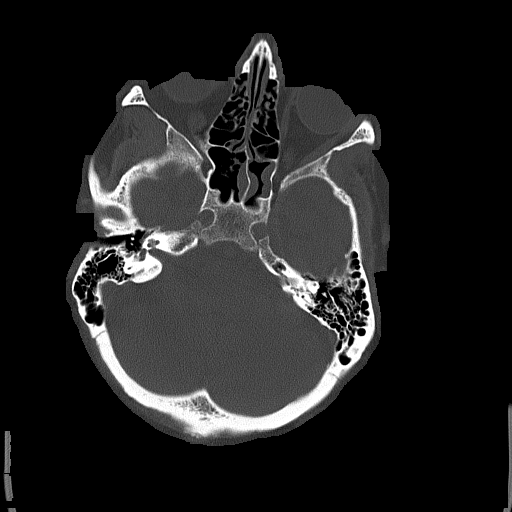
[im 25/83  bone]
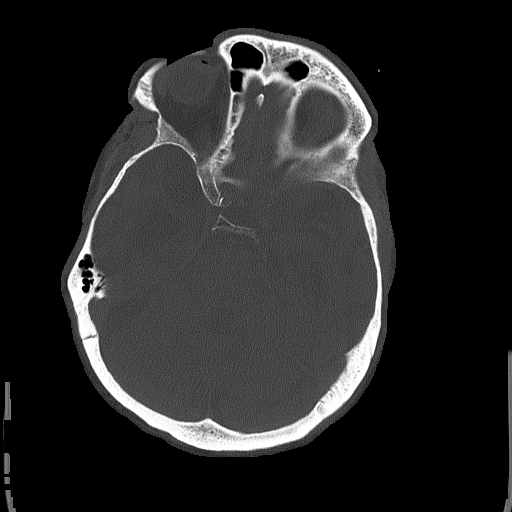

[Series 4: head wo · axial · 0.44mm/px · z∈[-88,+32]mm · 7 of 33 slices shown, 9 images]
[im 5/33  brain]
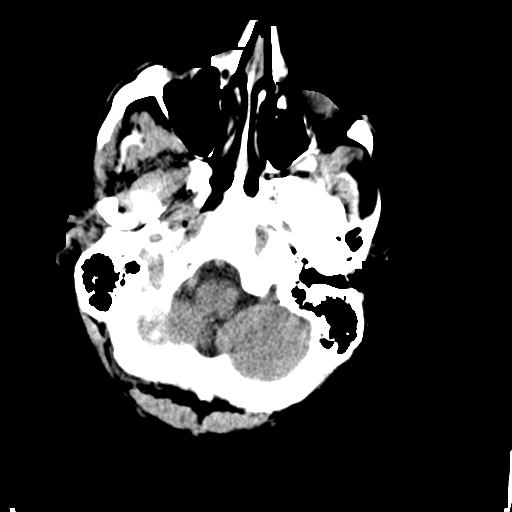
[im 5/33  bone]
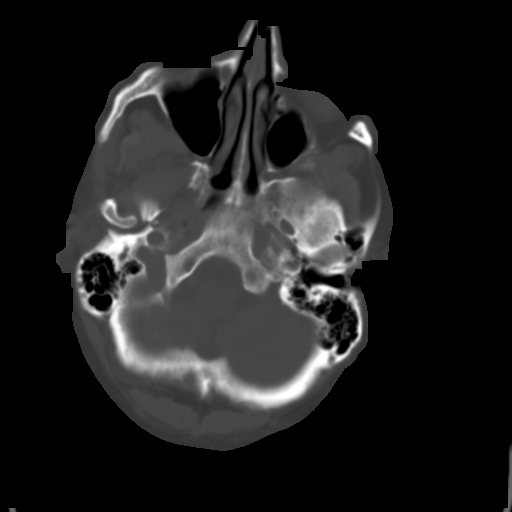
[im 9/33  brain]
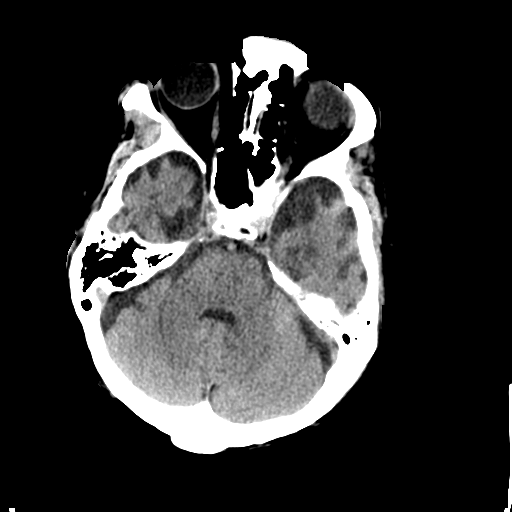
[im 13/33  brain]
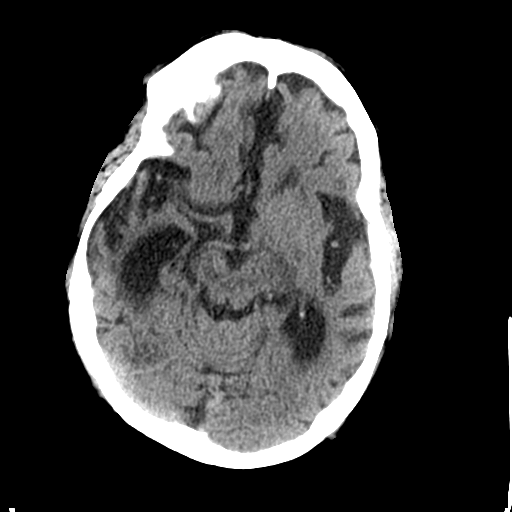
[im 17/33  brain]
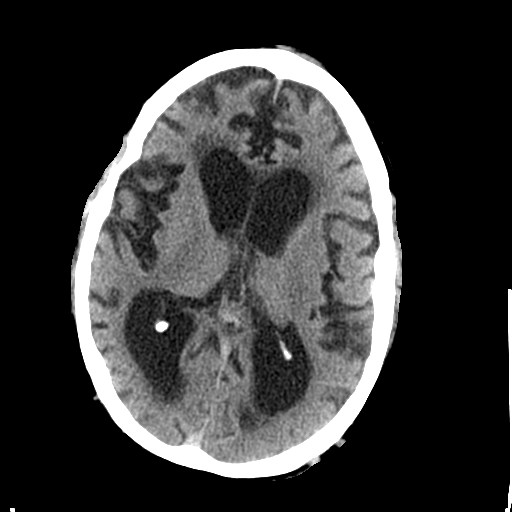
[im 21/33  brain]
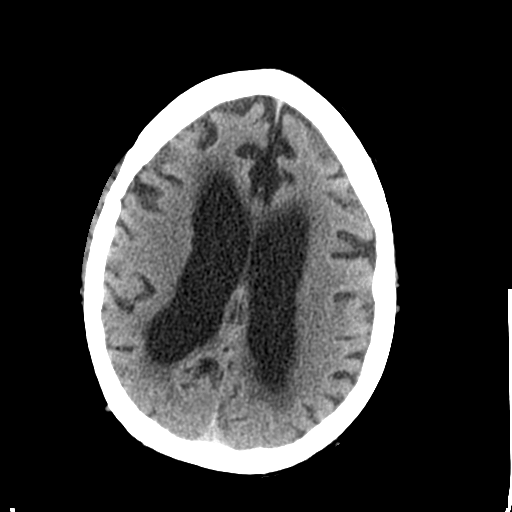
[im 21/33  bone]
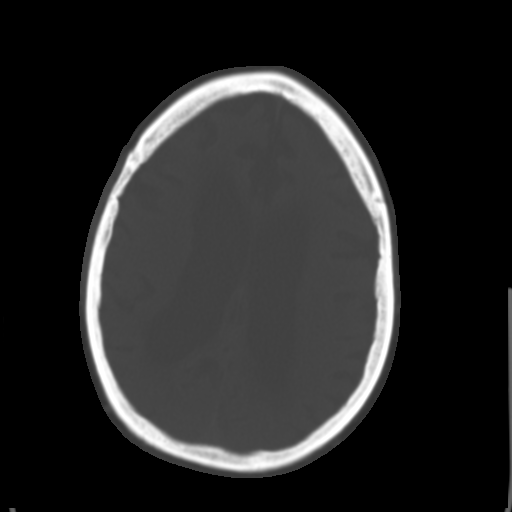
[im 25/33  brain]
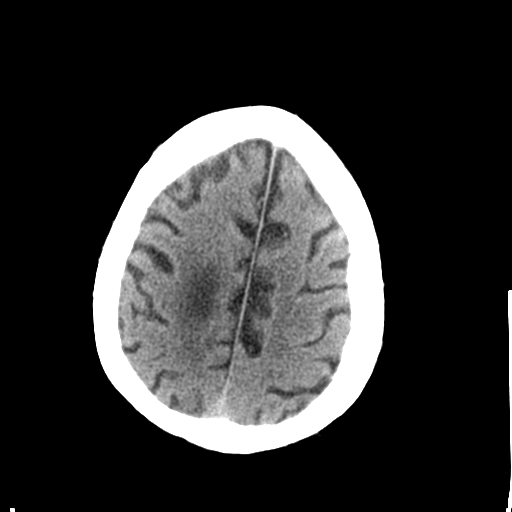
[im 29/33  brain]
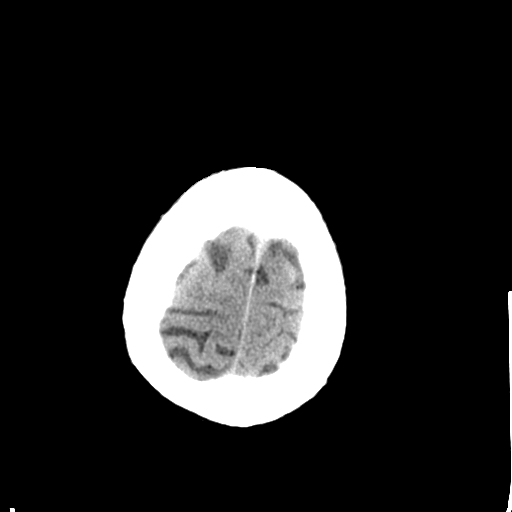

[Series 6: coronal soft tissue · coronal · 0.33mm/px · 3 of 69 slices shown]
[im 23/69  brain]
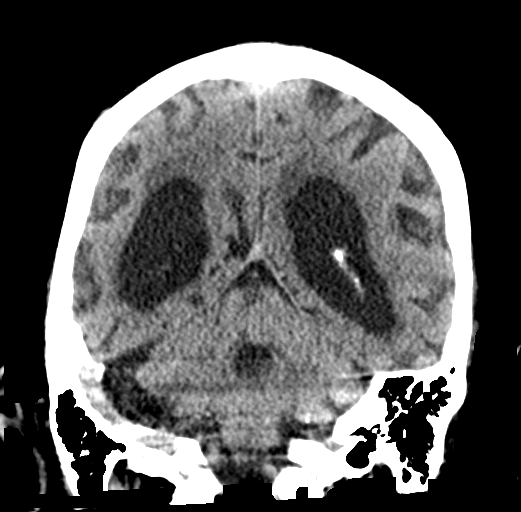
[im 31/69  brain]
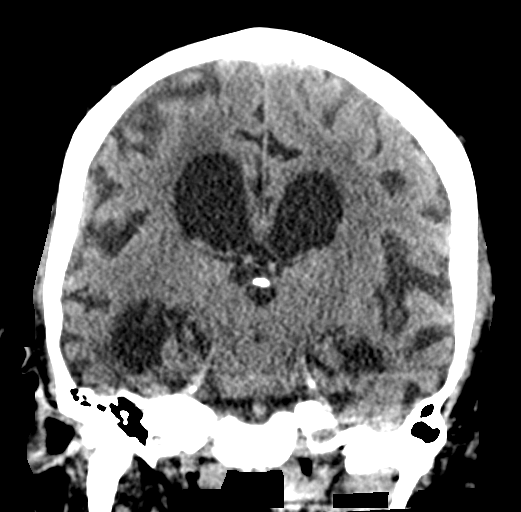
[im 38/69  brain]
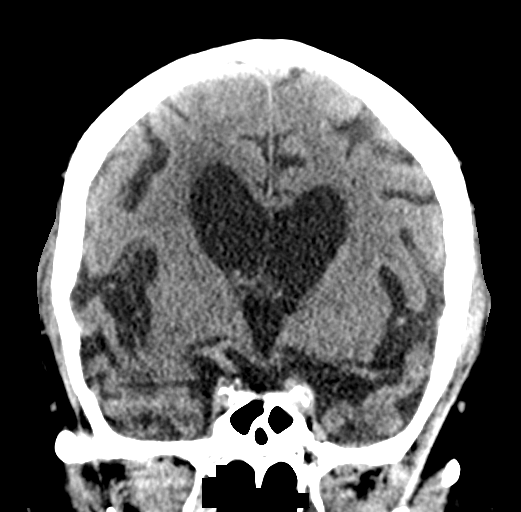

[Series 7: sagittal soft tissue · sagittal · 0.33mm/px · 3 of 55 slices shown]
[im 20/55  brain]
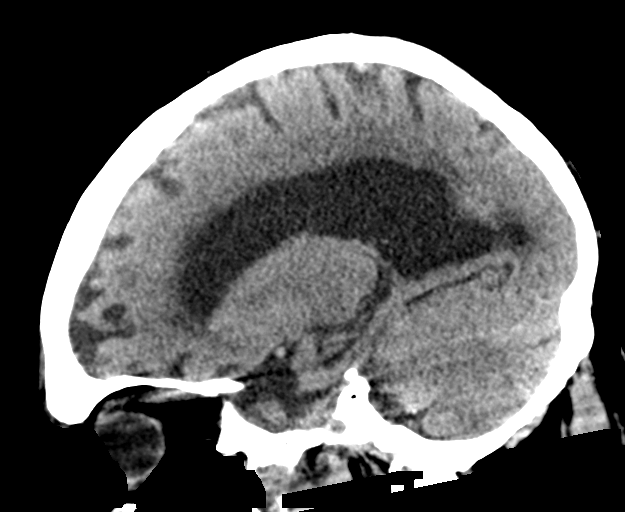
[im 28/55  brain]
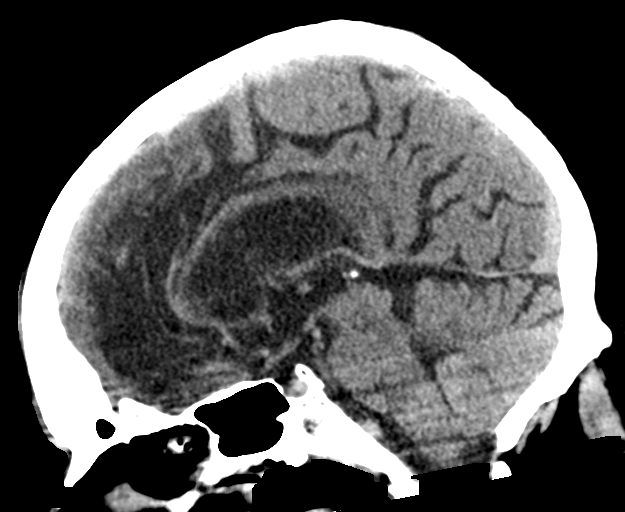
[im 36/55  brain]
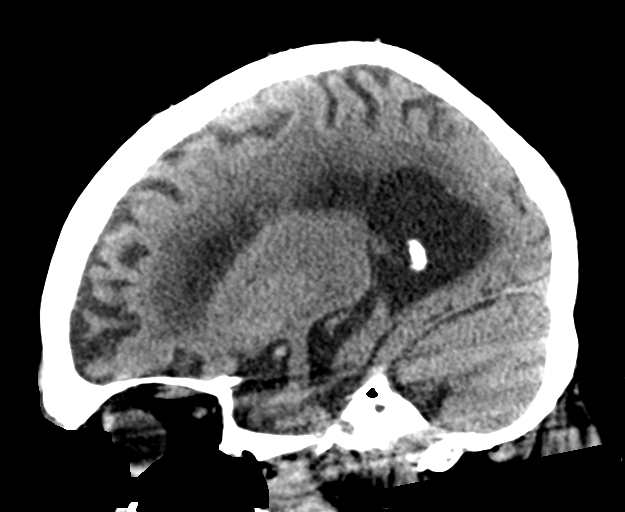

[16 of 47 positions shown; findings below may reference images not displayed]

FINDINGS: CT HEAD FINDINGS

Brain: No evidence of acute infarction, hemorrhage, cerebral edema,
mass, mass effect, or midline shift. No hydrocephalus or extra-axial
fluid collection. Redemonstrated advanced cerebral atrophy for age,
with ventriculomegaly that is commensurate with sulcal size. No
lobar predominance to the atrophy. Periventricular white matter
changes, likely the sequela of chronic small vessel ischemic
disease.

Vascular: No hyperdense vessel.

Skull: Normal. Negative for fracture or focal lesion.

Sinuses/Orbits: Mucosal thickening in the sphenoid sinuses. The
orbits are unremarkable.

Other: The mastoid air cells are well aerated.

CT CERVICAL SPINE FINDINGS

Evaluation is somewhat limited by motion artifact.

Alignment: Straightening of the normal cervical lordosis. No
listhesis.

Skull base and vertebrae: No acute fracture. No primary bone lesion
or focal pathologic process.

Soft tissues and spinal canal: No prevertebral fluid or swelling. No
visible canal hematoma.

Disc levels: Multilevel degenerative changes without significant
spinal canal stenosis. Multilevel uncovertebral and facet
arthropathy, which causes severe neural foraminal narrowing on the
right at C3-C4 and C4-C5.

Upper chest: Limited by motion.  No focal pulmonary opacity.

Other: None.
IMPRESSION: 1.  No acute intracranial process.
2.  No acute fracture or traumatic listhesis in the cervical spine.
